# Patient Record
Sex: Male | Born: 1960
Health system: Southern US, Community
[De-identification: ages and names within clinical notes are randomized; demographics above are authoritative.]

## PROBLEM LIST (undated history)

## (undated) DIAGNOSIS — K219 Gastro-esophageal reflux disease without esophagitis: Secondary | ICD-10-CM

## (undated) DIAGNOSIS — I10 Essential (primary) hypertension: Secondary | ICD-10-CM

## (undated) DIAGNOSIS — G4730 Sleep apnea, unspecified: Secondary | ICD-10-CM

## (undated) DIAGNOSIS — E119 Type 2 diabetes mellitus without complications: Secondary | ICD-10-CM

## (undated) DIAGNOSIS — R519 Headache, unspecified: Secondary | ICD-10-CM

## (undated) DIAGNOSIS — R51 Headache: Secondary | ICD-10-CM

## (undated) HISTORY — DX: Essential (primary) hypertension: I10

## (undated) HISTORY — PX: UVULECTOMY: SHX2631

## (undated) HISTORY — DX: Type 2 diabetes mellitus without complications: E11.9

## (undated) HISTORY — PX: TONSILLECTOMY: SUR1361

## (undated) HISTORY — PX: APPENDECTOMY: SHX54

---

## 1998-12-26 ENCOUNTER — Ambulatory Visit (HOSPITAL_BASED_OUTPATIENT_CLINIC_OR_DEPARTMENT_OTHER): Admission: RE | Admit: 1998-12-26 | Discharge: 1998-12-26 | Payer: Self-pay | Admitting: *Deleted

## 2005-12-23 ENCOUNTER — Emergency Department (HOSPITAL_COMMUNITY): Admission: EM | Admit: 2005-12-23 | Discharge: 2005-12-23 | Payer: Self-pay | Admitting: Emergency Medicine

## 2007-07-05 ENCOUNTER — Encounter (INDEPENDENT_AMBULATORY_CARE_PROVIDER_SITE_OTHER): Payer: Self-pay | Admitting: General Surgery

## 2007-07-06 ENCOUNTER — Inpatient Hospital Stay (HOSPITAL_COMMUNITY): Admission: EM | Admit: 2007-07-06 | Discharge: 2007-07-07 | Payer: Self-pay | Admitting: Emergency Medicine

## 2010-11-20 NOTE — Op Note (Signed)
[name], Jackson Morris                 ACCOUNT NO.:  [id number]   MEDICAL RECORD NO.:  [id number]          PATIENT TYPE:  OBV   LOCATION:  A226                          FACILITY:  [facility]   PHYSICIAN:  Dalia Heading, M.D.  DATE OF BIRTH:  Apr 19, 1961   DATE OF PROCEDURE:  07/05/2007  DATE OF DISCHARGE:                               OPERATIVE REPORT   PREOPERATIVE DIAGNOSIS:  Acute appendicitis.   POSTOPERATIVE DIAGNOSIS:  Acute appendicitis.   PROCEDURE:  Laparoscopic appendectomy.   SURGEON:  Dalia Heading, M.D.   ANESTHESIA:  General endotracheal.   INDICATIONS:  The patient is a 50 year old white male who presents with  right lower quadrant abdominal pain.  CT scan of the abdomen and pelvis  reveals acute appendicitis.  The patient now comes to the operating room  for a laparoscopic appendectomy.  The risks and benefits of the  procedure, including bleeding, infection, and the possibility of an open  procedure were fully explained to the patient, who gave informed  consent.   PROCEDURE NOTE:  The patient was placed in the supine position.  After  induction of general endotracheal anesthesia, the abdomen was prepped  and draped using the usual sterile technique with Betadine.  Surgical  site confirmation was performed.   A supraumbilical incision was made down to the fascia.  A Veress needle  was introduced into the abdominal cavity, and confirmation of placement  was done using the saline drop test.  The abdomen was then insufflated  to 16 mmHg pressure.  An 11 mm trocar was introduced into the abdominal  cavity under direct visualization without difficulty.  The patient was  placed in deeper Trendelenburg position.  An additional 12 mm trocar was  placed in suprapubic region, and a 5mm trocar was placed in the left  lower quadrant region.  The appendix was visualized and noted to be  gangrenous in nature.  During the dissection, the appendicolith did  perforate through the  proximal portion of the appendix.  The  appendicolith as well as the appendix was removed.  A standard Endo-GIA  was placed across the base of the appendix and fired.  Two small clips  were also placed in an area of necrotic tissue.  The right lower  quadrant was copiously irrigated with normal saline.  There was some  minimal spillage during the procedure.  A #10 flat Jackson-Pratt drain  was placed into this region and brought out through the 5 mm trocar  site.  It was secured at the skin level using a 3-0 nylon interrupted  suture.  The staple line was inspected, and there was no evidence of  disruption.  All fluid and air were then evacuate from the abdominal  cavity prior to removal of the trocars.   All wounds were irrigated with normal saline.  All wounds were injected  with 0.5% Sensorcaine.  The supraumbilical fascia as well as suprapubic  fascia were reapproximated using 0 Vicryl interrupted sutures.  All skin  incisions were closed using staples.  Betadine ointment and dry sterile  dressings were applied.  All tape and needle counts were correct at the end of the procedure.  The patient was extubated in the operating room nd went back to recovery  room awake, in stable condition.   COMPLICATIONS:  None.   SPECIMEN:  Appendix.   BLOOD LOSS:  Minimal.   DRAINS:  Jackson-Pratt drain to right lower quadrant and pelvis.      Dalia Heading, M.D.  Electronically Signed     MAJ/MEDQ  D:  07/05/2007  T:  07/06/2007  Job:  [id number]   cc:   Ramon Dredge L. Juanetta Gosling, M.D.  Fax: [phone]

## 2010-11-23 NOTE — Discharge Summary (Signed)
Jackson Morris, Jackson Morris                 ACCOUNT NO.:  [id number]   MEDICAL RECORD NO.:  [id number]          PATIENT TYPE:  INP   LOCATION:  A322                          FACILITY:  [facility]   PHYSICIAN:  Dalia Heading, M.D.  DATE OF BIRTH:  [birth year]   DATE OF ADMISSION:  07/05/2007  DATE OF DISCHARGE:  12/30/2008LH                               DISCHARGE SUMMARY   HOSPITAL COURSE SUMMARY:  The patient is a 50 year old white male who  presented to the emergency room with worsening right lower quadrant  abdominal pain.  He was found to have acute appendicitis by CT scan of  the abdomen and pelvis.  He was taken to the operating room and  underwent a laparoscopic appendectomy.  He tolerated the surgery well.  His postoperative course was remarkable for nausea.  He thus was  admitted for an additional 24 hours.  His nausea subsequently resolved  and he was discharged home on July 07, 2007 in good and improving  condition.   DISCHARGE INSTRUCTIONS:  1. The patient was to follow up Dr. Franky Macho on July 14, 2007.  2. He was to drain his bulb suction twice a day.   DISCHARGE MEDICATIONS:  1. Augmentin 875 mg p.o. b.i.d.  2. Percocet 1-2 tablets p.o. q.4 h. p.r.n. pain.  3. Benicar 20 mg p.o. daily.  4. Prevacid 30 mg p.o. daily.  5. Vytorin 20 mg p.o. daily.  6. Actos 15 mg p.o. daily.  7. Glimepiride 10 mg p.o. daily.   PRINCIPAL DIAGNOSES:  1. Acute appendicitis.  2. Non-insulin-dependent diabetes mellitus.   PRINCIPAL PROCEDURE:  Laparoscopic appendectomy on July 05, 2007.      Dalia Heading, M.D.  Electronically Signed     MAJ/MEDQ  D:  07/22/2007  T:  07/22/2007  Job:  [id number]   cc:   Dalia Heading, M.D.  Fax: [phone]   Oneal Deputy. Juanetta Gosling, M.D.  Fax: [phone]

## 2011-04-12 LAB — BASIC METABOLIC PANEL
BUN: 18
CO2: 29
Calcium: 9.5
Chloride: 101
Chloride: 96
Creatinine, Ser: 1.05
Creatinine, Ser: 1.35
Potassium: 3.4 — ABNORMAL LOW
Sodium: 135

## 2011-04-12 LAB — CBC
HCT: 39.4
HCT: 41.2
HCT: 46.5
Hemoglobin: 13.6
Hemoglobin: 16.1
MCHC: 34.5
MCV: 90.4
MCV: 91.7
MCV: 91.8
Platelets: 187
Platelets: 196
RBC: 4.29
RBC: 5.15
RDW: 12.2
RDW: 12.6
RDW: 12.8

## 2011-04-12 LAB — DIFFERENTIAL
Basophils Absolute: 0
Eosinophils Absolute: 0
Eosinophils Absolute: 0
Eosinophils Relative: 0
Eosinophils Relative: 1
Lymphocytes Relative: 13
Lymphs Abs: 1.8
Lymphs Abs: 1.9
Monocytes Absolute: 1
Monocytes Absolute: 1.2 — ABNORMAL HIGH
Monocytes Relative: 5
Monocytes Relative: 8
Neutro Abs: 10.7 — ABNORMAL HIGH
Neutro Abs: 12.6 — ABNORMAL HIGH
Neutro Abs: 19.7 — ABNORMAL HIGH
Neutrophils Relative %: 78 — ABNORMAL HIGH

## 2011-04-12 LAB — URINALYSIS, ROUTINE W REFLEX MICROSCOPIC
Bilirubin Urine: NEGATIVE
Glucose, UA: NEGATIVE
Ketones, ur: NEGATIVE
Leukocytes, UA: NEGATIVE
Nitrite: NEGATIVE
Specific Gravity, Urine: 1.02

## 2011-04-12 LAB — URINE MICROSCOPIC-ADD ON

## 2013-09-15 ENCOUNTER — Telehealth: Payer: Self-pay

## 2013-09-15 NOTE — Telephone Encounter (Signed)
Pt was referred by Dr. Luan Pulling for screening colonoscopy. Having some constipation. OV with Neil Crouch, PA on 10/22/2013 at 10:30 AM.

## 2013-10-22 ENCOUNTER — Ambulatory Visit: Payer: Self-pay | Admitting: Gastroenterology

## 2013-11-11 ENCOUNTER — Ambulatory Visit (HOSPITAL_COMMUNITY)
Admission: RE | Admit: 2013-11-11 | Discharge: 2013-11-11 | Disposition: A | Discharge status: Home / Self Care | Payer: 59 | Source: Ambulatory Visit | Attending: Pulmonary Disease | Admitting: Pulmonary Disease

## 2013-11-11 ENCOUNTER — Other Ambulatory Visit (HOSPITAL_COMMUNITY): Payer: Self-pay | Admitting: Pulmonary Disease

## 2013-11-11 DIAGNOSIS — K5792 Diverticulitis of intestine, part unspecified, without perforation or abscess without bleeding: Secondary | ICD-10-CM

## 2013-11-11 DIAGNOSIS — K5732 Diverticulitis of large intestine without perforation or abscess without bleeding: Secondary | ICD-10-CM | POA: Insufficient documentation

## 2013-11-11 LAB — POCT I-STAT CREATININE: Creatinine, Ser: 1.4 mg/dL — ABNORMAL HIGH (ref 0.50–1.35)

## 2013-11-11 LAB — POCT I-STAT TROPONIN I: TROPONIN I, POC: 0 ng/mL (ref 0.00–0.08)

## 2013-11-11 MED ORDER — IOHEXOL 300 MG/ML  SOLN
50.0000 mL | Freq: Once | INTRAMUSCULAR | Status: AC | PRN
  Administered 2013-11-11: 50 mL via ORAL

## 2013-11-11 MED ORDER — SODIUM CHLORIDE 0.9 % IJ SOLN
INTRAMUSCULAR | Status: AC
  Filled 2013-11-11: qty 350

## 2013-11-11 MED ORDER — IOHEXOL 300 MG/ML  SOLN
100.0000 mL | Freq: Once | INTRAMUSCULAR | Status: AC | PRN
  Administered 2013-11-11: 100 mL via INTRAVENOUS

## 2013-11-26 ENCOUNTER — Ambulatory Visit: Payer: Self-pay | Admitting: Gastroenterology

## 2013-11-30 ENCOUNTER — Encounter: Payer: Self-pay | Admitting: Gastroenterology

## 2013-11-30 ENCOUNTER — Ambulatory Visit: Payer: 59 | Admitting: Gastroenterology

## 2013-11-30 ENCOUNTER — Telehealth: Payer: Self-pay | Admitting: Gastroenterology

## 2013-11-30 NOTE — Telephone Encounter (Signed)
Mailed letter °

## 2013-11-30 NOTE — Telephone Encounter (Signed)
Pt was a no show

## 2014-06-15 ENCOUNTER — Telehealth: Payer: Self-pay | Admitting: Gastroenterology

## 2014-06-15 NOTE — Telephone Encounter (Signed)
PATIENT WIFE CALLED TO RESCHEDULE PATIENT AND I TOLD HER THAT [facility] NEEDED TO SEND IN ANOTHER REFERRAL DUE TO THE TIME THE ORIGINAL ONE WAS PUT IN

## 2016-09-26 DIAGNOSIS — J209 Acute bronchitis, unspecified: Secondary | ICD-10-CM | POA: Diagnosis not present

## 2016-09-26 DIAGNOSIS — E119 Type 2 diabetes mellitus without complications: Secondary | ICD-10-CM | POA: Diagnosis not present

## 2016-09-26 DIAGNOSIS — J019 Acute sinusitis, unspecified: Secondary | ICD-10-CM | POA: Diagnosis not present

## 2016-10-21 NOTE — Telephone Encounter (Addendum)
I spoke with the patient's wife and she stated he's been having some constipation. I made the  patient ov to see LL 5/11 at 11 am

## 2016-11-05 DIAGNOSIS — I10 Essential (primary) hypertension: Secondary | ICD-10-CM | POA: Diagnosis not present

## 2016-11-05 DIAGNOSIS — E119 Type 2 diabetes mellitus without complications: Secondary | ICD-10-CM | POA: Diagnosis not present

## 2016-11-05 DIAGNOSIS — K210 Gastro-esophageal reflux disease with esophagitis: Secondary | ICD-10-CM | POA: Diagnosis not present

## 2016-11-07 ENCOUNTER — Telehealth: Payer: Self-pay | Admitting: Acute Care

## 2016-11-07 DIAGNOSIS — Z87891 Personal history of nicotine dependence: Secondary | ICD-10-CM

## 2016-11-07 NOTE — Telephone Encounter (Signed)
Routed to [facility] pool by mistake...rerouted to lung nodule pool.

## 2016-11-08 DIAGNOSIS — M25512 Pain in left shoulder: Secondary | ICD-10-CM | POA: Diagnosis not present

## 2016-11-08 DIAGNOSIS — E119 Type 2 diabetes mellitus without complications: Secondary | ICD-10-CM | POA: Diagnosis not present

## 2016-11-08 DIAGNOSIS — I10 Essential (primary) hypertension: Secondary | ICD-10-CM | POA: Diagnosis not present

## 2016-11-11 NOTE — Telephone Encounter (Signed)
ATC - Received a fast busy signal - Will try back

## 2016-11-14 NOTE — Telephone Encounter (Signed)
Returning Lung screening call. Wife is aware that Langley Gauss may not come in today so will await her call back

## 2016-11-14 NOTE — Telephone Encounter (Signed)
Pt wife returning call and can be reached @ [phone].Hillery Hunter

## 2016-11-15 ENCOUNTER — Encounter: Payer: Self-pay | Admitting: Gastroenterology

## 2016-11-15 ENCOUNTER — Ambulatory Visit (INDEPENDENT_AMBULATORY_CARE_PROVIDER_SITE_OTHER): Payer: 59 | Admitting: Gastroenterology

## 2016-11-15 ENCOUNTER — Other Ambulatory Visit: Payer: Self-pay

## 2016-11-15 DIAGNOSIS — K625 Hemorrhage of anus and rectum: Secondary | ICD-10-CM | POA: Diagnosis not present

## 2016-11-15 DIAGNOSIS — K5900 Constipation, unspecified: Secondary | ICD-10-CM | POA: Diagnosis not present

## 2016-11-15 MED ORDER — NA SULFATE-K SULFATE-MG SULF 17.5-3.13-1.6 GM/177ML PO SOLN: 1.0000 | ORAL | 0 refills | Status: DC

## 2016-11-15 MED ORDER — LINACLOTIDE 145 MCG PO CAPS: 145.0000 ug | ORAL_CAPSULE | Freq: Every day | ORAL | 3 refills | Status: DC

## 2016-11-15 NOTE — Assessment & Plan Note (Signed)
56 year old gentleman with history of intermittent rectal pain/rectal bleeding in the setting of constipation. No prior colonoscopy. No family history of colon cancer. Suspect benign anorectal bleeding however patient needs to undergo colonoscopy for diagnostic purposes.  I have discussed the risks, alternatives, benefits with regards to but not limited to the risk of reaction to medication, bleeding, infection, perforation and the patient is agreeable to proceed. Written consent to be obtained.  Trial of Linzess 145 g daily before breakfast for management of constipation. Samples provided. Prescription sent to pharmacy.

## 2016-11-15 NOTE — Telephone Encounter (Signed)
Spoke with pt and scheduled for [facility] 11/22/16 3:30 CT ordered  Nothing further needed

## 2016-11-15 NOTE — Patient Instructions (Signed)
1. Start Linzess 131mcg daily before breakfast. The first few days he may have loose stools but this should improve after one week on medication. I have provided samples. We also sent a prescription to your pharmacy. It's very important to get your bowels moving better prior to your colonoscopy so that we can make sure your prep was adequate and your entire colon can be seen well. 2. Colonoscopy as scheduled. Please see separate instructions.

## 2016-11-15 NOTE — Progress Notes (Signed)
Primary Care Physician:  Sinda Du, MD  Primary Gastroenterologist:  Garfield Cornea, MD   Chief Complaint  Patient presents with  . Constipation  . Colonoscopy    never had tcs  . Hemorrhoids    has bleeding  . Gastroesophageal Reflux    at times when eats certain foods    HPI:  Jackson Morris is a 56 y.o. male here at the request of Dr. Luan Pulling for colonoscopy. Patient has never had one. No known FH colon cancer. He has had issues with constipation. Some brbpr at times. May have 2-3 stools daily, [place] 1-3 but then may have 3 days without BM. Rectal pain at times and uses anucort as needed. Takes stool softener daily. Occasional heartburn, with certain foods. No dysphagia, vomiting, unintentional weight loss.      Current Outpatient Prescriptions  Medication Sig Dispense Refill  . acetaminophen (TYLENOL) 500 MG tablet Take 1,000 mg by mouth every 6 (six) hours as needed.    . fluticasone (FLONASE) 50 MCG/ACT nasal spray Place 2 sprays into both nostrils daily.    Marland Kitchen glimepiride (AMARYL) 4 MG tablet Take 1 tablet by mouth daily.  1  . GLYXAMBI 10-5 MG TABS Take 1 tablet by mouth daily.  1  . olmesartan-hydrochlorothiazide (BENICAR HCT) 40-25 MG tablet Take 1 tablet by mouth daily.  1  . OVER THE COUNTER MEDICATION Goody powder as needed for headache    . rosuvastatin (CRESTOR) 10 MG tablet Take 1 tablet by mouth daily.  1   No current facility-administered medications for this visit.     Allergies as of 11/15/2016  . (No Known Allergies)    Past Medical History:  Diagnosis Date  . Diabetes ([place])   . HTN (hypertension)     Past Surgical History:  Procedure Laterality Date  . APPENDECTOMY    . TONSILLECTOMY    . UVULECTOMY      Family History  Problem Relation Age of Onset  . Colon cancer Neg Hx     Social History   Social History  . Marital status: Married    Spouse name: N/A  . Number of children: N/A  . Years of education: N/A   Occupational History   . Not on file.   Social History Main Topics  . Smoking status: Current Every Day Smoker    Types: E-cigarettes  . Smokeless tobacco: Never Used  . Alcohol use No  . Drug use: No  . Sexual activity: Not on file   Other Topics Concern  . Not on file   Social History Narrative  . No narrative on file      ROS:  General: Negative for anorexia, weight loss, fever, chills, fatigue, weakness. Eyes: Negative for vision changes.  ENT: Negative for hoarseness, difficulty swallowing , nasal congestion. CV: Negative for chest pain, angina, palpitations, dyspnea on exertion, peripheral edema.  Respiratory: Negative for dyspnea at rest, dyspnea on exertion, cough, sputum, wheezing.  GI: See history of present illness. GU:  Negative for dysuria, hematuria, urinary incontinence, urinary frequency, nocturnal urination.  MS: Negative for joint pain, low back pain.  Derm: Negative for rash or itching.  Neuro: Negative for weakness, abnormal sensation, seizure, frequent headaches, memory loss, confusion.  Psych: Negative for anxiety, depression, suicidal ideation, hallucinations.  Endo: Negative for unusual weight change.  Heme: Negative for bruising or bleeding. Allergy: Negative for rash or hives.    Physical Examination:  BP 111/61   Pulse (!) 55   Temp 97.3  F (36.3 C) (Oral)   Ht 5\' 8"  (1.727 m)   Wt 212 lb (96.2 kg)   BMI 32.23 kg/m    General: Well-nourished, well-developed in no acute distress.  Head: Normocephalic, atraumatic.   Eyes: Conjunctiva pink, no icterus. Mouth: Oropharyngeal mucosa moist and pink , no lesions erythema or exudate. Neck: Supple without thyromegaly, masses, or lymphadenopathy.  Lungs: Clear to auscultation bilaterally.  Heart: Regular rate and rhythm, no murmurs rubs or gallops.  Abdomen: Bowel sounds are normal, nontender, nondistended, no hepatosplenomegaly or masses, no abdominal bruits or    hernia , no rebound or guarding.   Rectal:  deferred Extremities: No lower extremity edema. No clubbing or deformities.  Neuro: Alert and oriented x 4 , grossly normal neurologically.  Skin: Warm and dry, no rash or jaundice.   Psych: Alert and cooperative, normal mood and affect.

## 2016-11-18 ENCOUNTER — Other Ambulatory Visit: Payer: Self-pay

## 2016-11-18 DIAGNOSIS — K625 Hemorrhage of anus and rectum: Secondary | ICD-10-CM

## 2016-11-18 DIAGNOSIS — K5900 Constipation, unspecified: Secondary | ICD-10-CM

## 2016-11-18 NOTE — Patient Instructions (Signed)
PA info for Colonoscopy submitted via [facility] website. Case approved. PA# [id number].

## 2016-11-18 NOTE — Progress Notes (Signed)
CC'ED TO PCP 

## 2016-11-19 ENCOUNTER — Telehealth: Payer: Self-pay | Admitting: Acute Care

## 2016-11-21 NOTE — Telephone Encounter (Signed)
Spoke with pt's wife and informed that we do not have an earlier time slot tomorrow for [facility].  I offered her to reschedule to another day but she states that pt will keep this original appt.  Nothing further needed.

## 2016-11-22 ENCOUNTER — Ambulatory Visit (INDEPENDENT_AMBULATORY_CARE_PROVIDER_SITE_OTHER): Payer: 59 | Admitting: Acute Care

## 2016-11-22 ENCOUNTER — Encounter: Payer: Self-pay | Admitting: Acute Care

## 2016-11-22 ENCOUNTER — Ambulatory Visit (INDEPENDENT_AMBULATORY_CARE_PROVIDER_SITE_OTHER)
Admission: RE | Admit: 2016-11-22 | Discharge: 2016-11-22 | Disposition: A | Discharge status: Home / Self Care | Payer: 59 | Source: Ambulatory Visit | Attending: Acute Care | Admitting: Acute Care

## 2016-11-22 DIAGNOSIS — Z87891 Personal history of nicotine dependence: Secondary | ICD-10-CM | POA: Diagnosis not present

## 2016-11-22 NOTE — Progress Notes (Signed)
Shared Decision Making Visit Lung Cancer Screening Program [phone])   Eligibility:  Age 56 y.o.  Pack Years Smoking History Calculation 35 pack year smoking history (# packs/per year x # years smoked)  Recent History of coughing up blood  no  Unexplained weight loss? no ( >Than 15 pounds within the last 6 months )  Prior History Lung / other cancer no (Diagnosis within the last 5 years already requiring surveillance chest CT Scans).  Smoking Status Former Smoker  Former Smokers: Years since quit: 4 years  Quit Date: 07/08/2012  Visit Components:  Discussion included one or more decision making aids. yes  Discussion included risk/benefits of screening. yes  Discussion included potential follow up diagnostic testing for abnormal scans. yes  Discussion included meaning and risk of over diagnosis. yes  Discussion included meaning and risk of False Positives. yes  Discussion included meaning of total radiation exposure. yes  Counseling Included:  Importance of adherence to annual lung cancer LDCT screening. yes  Impact of comorbidities on ability to participate in the program. yes  Ability and willingness to under diagnostic treatment. yes  Smoking Cessation Counseling:  Current Smokers:   Discussed importance of smoking cessation. no  Information about tobacco cessation classes and interventions provided to patient. yes  Patient provided with "ticket" for LDCT Scan. yes  Symptomatic Patient. no  Counseling  Diagnosis Code: Tobacco Use Z72.0  Asymptomatic Patient yes  Counseling (Intermediate counseling: > three minutes counseling) U0454  Former Smokers:   Discussed the importance of maintaining cigarette abstinence. yes  Diagnosis Code: Personal History of Nicotine Dependence. U98.119  Information about tobacco cessation classes and interventions provided to patient. Yes  Patient provided with "ticket" for LDCT Scan. yes  Written Order for Lung  Cancer Screening with LDCT placed in Epic. Yes (CT Chest Lung Cancer Screening Low Dose W/O CM) [id number] Z12.2-Screening of respiratory organs Z87.891-Personal history of nicotine dependence  I spent 25 minutes of face to face time with Mr. And Mrs Remmel  discussing the risks and benefits of lung cancer screening. We viewed a power point together that explained in detail the above noted topics. We took the time to pause the power point at intervals to allow for questions to be asked and answered to ensure understanding. We discussed that he  had taken the single most powerful action possible to decrease his  risk of developing lung cancer when he quit smoking. I counseled him to remain smoke free, and to contact me if he  ever had the desire to smoke again so that I can provide resources and tools to help support the effort to remain smoke free. We discussed the time and location of the scan, and that either  Doroteo Glassman RN or I will call with the results within  24-48 hours of receiving them. Mr. Geister  has my card and contact information in the event he needs to speak with me, in addition to a copy of the power point we reviewed as a resource. The patient  verbalized understanding of all of the above and had no further questions upon leaving the office.   The patient is using an e cigarette to maintain tobacco free status. I have asked him to try to quit, as we do not know the effects of long term vaping.  I have spent 3 minutes counseling patient on smoking cessation this visit. ( He is a previous tobacco smoker, now an e cig smoker.)  I explained to the patient that  there has been a high incidence of coronary artery disease noted on these exams. I explained that this is a non-gated exam therefore degree or severity cannot be determined. This patient is currently on statin therapy. I have asked the patient to follow-up with their PCP regarding any incidental finding of coronary artery disease and  management with diet or medication as they feel is clinically indicated. The patient verbalized understanding of the above and had no further questions.     Magdalen Spatz, NP 11/22/2016

## 2016-11-27 ENCOUNTER — Other Ambulatory Visit: Payer: Self-pay | Admitting: Acute Care

## 2016-11-27 DIAGNOSIS — Z87891 Personal history of nicotine dependence: Secondary | ICD-10-CM

## 2016-12-03 ENCOUNTER — Telehealth: Payer: Self-pay | Admitting: Internal Medicine

## 2016-12-03 NOTE — Telephone Encounter (Signed)
[phone], pt wife stacy called and stated the linzess with the coupon was still 400$, can he take something else.  Please advise.

## 2016-12-03 NOTE — Telephone Encounter (Signed)
Called [facility] to find out if it was a PA issue or if the pt has a high deductible plan. Had to leave a message on the voicemail.  Asked the pharmacy to let me know what is going on with the rx.

## 2016-12-04 MED ORDER — POLYETHYLENE GLYCOL 3350 17 GM/SCOOP PO POWD: ORAL | 11 refills | Status: DC

## 2016-12-04 NOTE — Addendum Note (Signed)
Addended by: Mahala Menghini on: 12/04/2016 01:51 PM   Modules accepted: Orders

## 2016-12-04 NOTE — Telephone Encounter (Signed)
We can provide some samples if you want 145 or 290 to use prior to colonoscopy to get bowels moving and have adequate bowel prep. Take one on empty stomach starting 5 days before procedure.   Can use miralax for chronic regimen. 17gram orally twice daily until regular BM then once daily. RX sent.

## 2016-12-04 NOTE — Telephone Encounter (Signed)
Josh, Pharmacist from [employer], he said the pts copay with the copay card was $333.00. They think he has not met his deductible.

## 2016-12-20 ENCOUNTER — Telehealth: Payer: Self-pay

## 2016-12-20 ENCOUNTER — Encounter (HOSPITAL_COMMUNITY): Payer: Self-pay | Admitting: *Deleted

## 2016-12-20 ENCOUNTER — Ambulatory Visit (HOSPITAL_COMMUNITY)
Admission: RE | Admit: 2016-12-20 | Discharge: 2016-12-20 | Disposition: A | Discharge status: Home / Self Care | Payer: 59 | Source: Ambulatory Visit | Attending: Internal Medicine | Admitting: Internal Medicine

## 2016-12-20 ENCOUNTER — Encounter (HOSPITAL_COMMUNITY)
Admission: RE | Disposition: A | Discharge status: Home / Self Care | Payer: Self-pay | Source: Ambulatory Visit | Attending: Internal Medicine

## 2016-12-20 DIAGNOSIS — K640 First degree hemorrhoids: Secondary | ICD-10-CM

## 2016-12-20 DIAGNOSIS — Z79899 Other long term (current) drug therapy: Secondary | ICD-10-CM | POA: Insufficient documentation

## 2016-12-20 DIAGNOSIS — K635 Polyp of colon: Secondary | ICD-10-CM | POA: Diagnosis not present

## 2016-12-20 DIAGNOSIS — K5900 Constipation, unspecified: Secondary | ICD-10-CM | POA: Insufficient documentation

## 2016-12-20 DIAGNOSIS — D122 Benign neoplasm of ascending colon: Secondary | ICD-10-CM | POA: Diagnosis not present

## 2016-12-20 DIAGNOSIS — Z87891 Personal history of nicotine dependence: Secondary | ICD-10-CM | POA: Diagnosis not present

## 2016-12-20 DIAGNOSIS — Z833 Family history of diabetes mellitus: Secondary | ICD-10-CM | POA: Insufficient documentation

## 2016-12-20 DIAGNOSIS — E119 Type 2 diabetes mellitus without complications: Secondary | ICD-10-CM | POA: Insufficient documentation

## 2016-12-20 DIAGNOSIS — I10 Essential (primary) hypertension: Secondary | ICD-10-CM | POA: Insufficient documentation

## 2016-12-20 DIAGNOSIS — D120 Benign neoplasm of cecum: Secondary | ICD-10-CM

## 2016-12-20 DIAGNOSIS — K621 Rectal polyp: Secondary | ICD-10-CM | POA: Insufficient documentation

## 2016-12-20 DIAGNOSIS — K219 Gastro-esophageal reflux disease without esophagitis: Secondary | ICD-10-CM | POA: Insufficient documentation

## 2016-12-20 DIAGNOSIS — Z7984 Long term (current) use of oral hypoglycemic drugs: Secondary | ICD-10-CM | POA: Diagnosis not present

## 2016-12-20 DIAGNOSIS — D127 Benign neoplasm of rectosigmoid junction: Secondary | ICD-10-CM | POA: Diagnosis not present

## 2016-12-20 DIAGNOSIS — K625 Hemorrhage of anus and rectum: Secondary | ICD-10-CM

## 2016-12-20 DIAGNOSIS — K921 Melena: Secondary | ICD-10-CM

## 2016-12-20 DIAGNOSIS — Z823 Family history of stroke: Secondary | ICD-10-CM | POA: Diagnosis not present

## 2016-12-20 HISTORY — DX: Gastro-esophageal reflux disease without esophagitis: K21.9

## 2016-12-20 HISTORY — DX: Headache: R51

## 2016-12-20 HISTORY — PX: COLONOSCOPY: SHX5424

## 2016-12-20 HISTORY — DX: Headache, unspecified: R51.9

## 2016-12-20 LAB — GLUCOSE, CAPILLARY: Glucose-Capillary: 122 mg/dL — ABNORMAL HIGH (ref 65–99)

## 2016-12-20 SURGERY — COLONOSCOPY
Anesthesia: Moderate Sedation

## 2016-12-20 MED ORDER — ONDANSETRON HCL 4 MG/2ML IJ SOLN
INTRAMUSCULAR | Status: DC | PRN
  Administered 2016-12-20: 4 mg via INTRAVENOUS

## 2016-12-20 MED ORDER — ONDANSETRON HCL 4 MG/2ML IJ SOLN
INTRAMUSCULAR | Status: AC
  Filled 2016-12-20: qty 2

## 2016-12-20 MED ORDER — SODIUM CHLORIDE 0.9 % IV SOLN
INTRAVENOUS | Status: DC
  Administered 2016-12-20: 1000 mL via INTRAVENOUS

## 2016-12-20 MED ORDER — SIMETHICONE 40 MG/0.6ML PO SUSP
ORAL | Status: AC
  Filled 2016-12-20: qty 30

## 2016-12-20 MED ORDER — MIDAZOLAM HCL 5 MG/5ML IJ SOLN
INTRAMUSCULAR | Status: AC
  Filled 2016-12-20: qty 10

## 2016-12-20 MED ORDER — MIDAZOLAM HCL 5 MG/5ML IJ SOLN
INTRAMUSCULAR | Status: DC | PRN
  Administered 2016-12-20 (×2): 2 mg via INTRAVENOUS
  Administered 2016-12-20: 1 mg via INTRAVENOUS

## 2016-12-20 MED ORDER — MEPERIDINE HCL 100 MG/ML IJ SOLN
INTRAMUSCULAR | Status: DC | PRN
  Administered 2016-12-20: 50 mg via INTRAVENOUS
  Administered 2016-12-20: 25 mg via INTRAVENOUS

## 2016-12-20 MED ORDER — MEPERIDINE HCL 100 MG/ML IJ SOLN
INTRAMUSCULAR | Status: AC
  Filled 2016-12-20: qty 2

## 2016-12-20 NOTE — Op Note (Signed)
[facility] Patient Name: Jackson Morris Procedure Date: 12/20/2016 7:31 AM MRN: [id number] Date of Birth: Jan 26, 1961 Attending MD: Norvel Richards , MD CSN: [id number] Age: 56 Admit Type: Outpatient Procedure:                Colonoscopy Indications:              Hematochezia Providers:                Norvel Richards, MD, Lurline Del, RN, Purcell Nails.                            Banks, Merchant navy officer Referring MD:              Medicines:                Midazolam 5 mg IV, Meperidine 75 mg IV, Ondansetron                            4 mg IV Complications:            No immediate complications. Estimated Blood Loss:     Estimated blood loss was minimal. Procedure:                Pre-Anesthesia Assessment:                           - Prior to the procedure, a History and Physical                            was performed, and patient medications and                            allergies were reviewed. The patient's tolerance of                            previous anesthesia was also reviewed. The risks                            and benefits of the procedure and the sedation                            options and risks were discussed with the patient.                            All questions were answered, and informed consent                            was obtained. Prior Anticoagulants: The patient has                            taken no previous anticoagulant or antiplatelet                            agents. ASA Grade Assessment: II - A patient with  mild systemic disease. After reviewing the risks                            and benefits, the patient was deemed in                            satisfactory condition to undergo the procedure.                           After obtaining informed consent, the colonoscope                            was passed under direct vision. Throughout the                            procedure, the patient's blood pressure, pulse,  and                            oxygen saturations were monitored continuously. The                            Colonoscope was introduced through the anus and                            advanced to the the cecum, identified by                            appendiceal orifice and ileocecal valve. The                            patient tolerated the procedure well. The entire                            colon was well visualized. The ileocecal valve,                            appendiceal orifice, and rectum were photographed.                            The quality of the bowel preparation was adequate. Scope In: 7:46:40 AM Scope Out: 8:16:30 AM Scope Withdrawal Time: 0 hours 24 minutes 54 seconds  Total Procedure Duration: 0 hours 29 minutes 50 seconds  Findings:      The perianal and digital rectal examinations were normal.      A 5 mm polyp was found in the recto-sigmoid colon. The polyp was       semi-pedunculated. The polyp was removed with a cold snare. Resection       and retrieval were complete. Estimated blood loss was minimal.      Internal hemorrhoids were found during retroflexion. The hemorrhoids       were moderate, medium-sized and Grade I (internal hemorrhoids that do       not prolapse).      A 11 mm polyp was found in the ileocecal valve. The polyp was sessile.       The polyp  was removed with a hot snare. Resection and retrieval were       complete. Estimated blood loss was minimal. Cautery malfunction- partial       cold snaring of larger polyp. Ultimately, 2 hemostasis clips placed to       ensure continued hemostasis with the healing process.      Two sessile polyps were found in the ascending colon. The polyps were 4       to 6 mm in size. These polyps were removed with a cold snare. Resection       and retrieval were complete. Estimated blood loss was minimal.      The exam was otherwise without abnormality on direct and retroflexion       views. Impression:                - One 5 mm polyp at the recto-sigmoid colon,                            removed with a cold snare. Resected and retrieved.                           - Internal hemorrhoids.                           - One 11 mm polyp at the ileocecal valve, removed                            with a hot snare. Resected and retrieved.                           - Two 4 to 6 mm polyps in the ascending colon,                            removed with a cold snare. Resected and retrieved.                           - The examination was otherwise normal on direct                            and retroflexion views. I suspect bleeding from                            hemorrhoids in the setting of constipation. Moderate Sedation:      Moderate (conscious) sedation was administered by the endoscopy nurse       and supervised by the endoscopist. The following parameters were       monitored: oxygen saturation, heart rate, blood pressure, respiratory       rate, EKG, adequacy of pulmonary ventilation, and response to care.       Total physician intraservice time was 37 minutes. Recommendation:           - Patient has a contact number available for                            emergencies. The signs and symptoms of potential  delayed complications were discussed with the                            patient. Return to normal activities tomorrow.                            Written discharge instructions were provided to the                            patient.                           - Resume previous diet.                           - Continue present medications. Decrease dose of                            lLinzess to 72 g daily?"samples available the                            office.                           - Repeat colonoscopy date to be determined after                            pending pathology results are reviewed for                            surveillance based on pathology  results.                           - Return to GI clinic in 6 weeks. No MRI until clear Procedure Code(s):        --- Professional ---                           [phone], Colonoscopy, flexible; with removal of                            tumor(s), polyp(s), or other lesion(s) by snare                            technique                           99152, Moderate sedation services provided by the                            same physician or other qualified health care                            professional performing the diagnostic or                            therapeutic service that the  sedation supports,                            requiring the presence of an independent trained                            observer to assist in the monitoring of the                            patient's level of consciousness and physiological                            status; initial 15 minutes of intraservice time,                            patient age 66 years or older                           [phone], Moderate sedation services; each additional                            15 minutes intraservice time Diagnosis Code(s):        --- Professional ---                           D12.7, Benign neoplasm of rectosigmoid junction                           D12.0, Benign neoplasm of cecum                           D12.2, Benign neoplasm of ascending colon                           K64.0, First degree hemorrhoids                           K92.1, Melena (includes Hematochezia) CPT copyright 2016 American Medical Association. All rights reserved. The codes documented in this report are preliminary and upon coder review may  be revised to meet current compliance requirements. Cristopher Estimable. Mariona Scholes, MD Norvel Richards, MD 12/20/2016 8:30:15 AM This report has been signed electronically. Number of Addenda: 0

## 2016-12-20 NOTE — Telephone Encounter (Signed)
Pt came by the office today after his procedure to get samples of Linzess 72 mcg but we did not have any. I told him that we would call when we got some in.

## 2016-12-20 NOTE — H&P (Signed)
@LOGO @   Primary Care Physician:  Sinda Du, MD Primary Gastroenterologist:  Dr. Gala Romney  Pre-Procedure History & Physical: HPI:  Jackson Morris is a 56 y.o. male here for here for colonoscopy due to rectal bleeding constipation or to lens is 145-gave him diarrhea. Did not like it.  Past Medical History:  Diagnosis Date  . Diabetes ([place])   . GERD (gastroesophageal reflux disease)   . Headache   . HTN (hypertension)     Past Surgical History:  Procedure Laterality Date  . APPENDECTOMY    . TONSILLECTOMY    . UVULECTOMY      Prior to Admission medications   Medication Sig Start Date End Date Taking? Authorizing Provider  acetaminophen (TYLENOL) 500 MG tablet Take 1,000 mg by mouth every 6 (six) hours as needed.   Yes [provider]  Aspirin-Acetaminophen-Caffeine (GOODYS EXTRA STRENGTH) [phone] MG PACK Take 1 Package by mouth daily as needed.   Yes [provider]  clotrimazole-betamethasone (LOTRISONE) cream Apply 1 application topically daily as needed (skin irritation).  09/26/16  Yes [provider]  docusate sodium (COLACE) 100 MG capsule Take 100 mg by mouth daily.   Yes [provider]  glimepiride (AMARYL) 4 MG tablet Take 1 tablet by mouth daily. 10/17/16  Yes [provider]  GLYXAMBI 10-5 MG TABS Take 1 tablet by mouth daily. 10/18/16  Yes [provider]  olmesartan-hydrochlorothiazide (BENICAR HCT) 40-25 MG tablet Take 1 tablet by mouth daily. 10/17/16  Yes [provider]  rosuvastatin (CRESTOR) 10 MG tablet Take 1 tablet by mouth daily. 10/17/16  Yes [provider]    Allergies as of 11/18/2016  . (No Known Allergies)    Family History  Problem Relation Age of Onset  . Cancer Mother   . Stroke Mother   . Diabetes Father   . Colon cancer Neg Hx     Social History   Social History  . Marital status: Married    Spouse name: N/A  . Number of children: N/A  . Years of education:  N/A   Occupational History  . Not on file.   Social History Main Topics  . Smoking status: Former Smoker    Packs/day: 1.00    Years: 35.00    Types: Cigarettes    Quit date: 07/08/2012  . Smokeless tobacco: Never Used     Comment: Currently smokes e cig. Quit tobacco in 2014  . Alcohol use No  . Drug use: No  . Sexual activity: Not on file   Other Topics Concern  . Not on file   Social History Narrative  . No narrative on file    Review of Systems: See HPI, otherwise negative ROS  Physical Exam: BP 127/78   Pulse (!) 51   Temp 97.8 F (36.6 C) (Oral)   Resp 13   SpO2 100%  General:   Alert,  Well-developed, well-nourished, pleasant and cooperative in NAD Neck:  Supple; no masses or thyromegaly. No significant cervical adenopathy. Lungs:  Clear throughout to auscultation.   No wheezes, crackles, or rhonchi. No acute distress. Heart:  Regular rate and rhythm; no murmurs, clicks, rubs,  or gallops. Abdomen: Non-distended, normal bowel sounds.  Soft and nontender without appreciable mass or hepatosplenomegaly.  Pulses:  Normal pulses noted. Extremities:  Without clubbing or edema.  Impression:  Pleasant 56 year old gentleman with intermittent rectal bleeding ascending constipation. No prior colonoscopy. Agree, with a need for colonoscopy at this time.  Recommendations:  The patient  that diagnostic colonoscopy today.   The risks, benefits, limitations, alternatives and imponderables have been reviewed with the patient. Questions have been answered. All parties are agreeable.   Notice: This dictation was prepared with Dragon dictation along with smaller phrase technology. Any transcriptional errors that result from this process are unintentional and may not be corrected upon review.

## 2016-12-20 NOTE — Discharge Instructions (Addendum)
Colonoscopy Discharge Instructions  Read the instructions outlined below and refer to this sheet in the next few weeks. These discharge instructions provide you with general information on caring for yourself after you leave the hospital. Your doctor may also give you specific instructions. While your treatment has been planned according to the most current medical practices available, unavoidable complications occasionally occur. If you have any problems or questions after discharge, call Dr. Gala Romney at [phone]. ACTIVITY  You may resume your regular activity, but move at a slower pace for the next 24 hours.   Take frequent rest periods for the next 24 hours.   Walking will help get rid of the air and reduce the bloated feeling in your belly (abdomen).   No driving for 24 hours (because of the medicine (anesthesia) used during the test).    Do not sign any important legal documents or operate any machinery for 24 hours (because of the anesthesia used during the test).  NUTRITION  Drink plenty of fluids.   You may resume your normal diet as instructed by your doctor.   Begin with a light meal and progress to your normal diet. Heavy or fried foods are harder to digest and may make you feel sick to your stomach (nauseated).   Avoid alcoholic beverages for 24 hours or as instructed.  MEDICATIONS  You may resume your normal medications unless your doctor tells you otherwise.  WHAT YOU CAN EXPECT TODAY  Some feelings of bloating in the abdomen.   Passage of more gas than usual.   Spotting of blood in your stool or on the toilet paper.  IF YOU HAD POLYPS REMOVED DURING THE COLONOSCOPY:  No aspirin products for 7 days or as instructed.   No alcohol for 7 days or as instructed.   Eat a soft diet for the next 24 hours.  FINDING OUT THE RESULTS OF YOUR TEST Not all test results are available during your visit. If your test results are not back during the visit, make an appointment  with your caregiver to find out the results. Do not assume everything is normal if you have not heard from your caregiver or the medical facility. It is important for you to follow up on all of your test results.  SEEK IMMEDIATE MEDICAL ATTENTION IF:  You have more than a spotting of blood in your stool.   Your belly is swollen (abdominal distention).   You are nauseated or vomiting.   You have a temperature over 101.   You have abdominal pain or discomfort that is severe or gets worse throughout the day.     Hemorrhoid and colon polyp information provided  No MRI in the future total clips gone  Trial of Linzess 72 - 1 daily. Go by the office for free samples  Pamphlet on hemorrhoid banding provided  Further recommendations to follow pending review of pathology report  Office visit with [place] in 6 weeks   Hemorrhoids Hemorrhoids are swollen veins in and around the rectum or anus. There are two types of hemorrhoids:  Internal hemorrhoids. These occur in the veins that are just inside the rectum. They may poke through to the outside and become irritated and painful.  External hemorrhoids. These occur in the veins that are outside of the anus and can be felt as a painful swelling or hard lump near the anus.  Most hemorrhoids do not cause serious problems, and they can be managed with home treatments such as diet and lifestyle  changes. If home treatments do not help your symptoms, procedures can be done to shrink or remove the hemorrhoids. What are the causes? This condition is caused by increased pressure in the anal area. This pressure may result from various things, including:  Constipation.  Straining to have a bowel movement.  Diarrhea.  Pregnancy.  Obesity.  Sitting for long periods of time.  Heavy lifting or other activity that causes you to strain.  Anal sex.  What are the signs or symptoms? Symptoms of this condition include:  Pain.  Anal itching or  irritation.  Rectal bleeding.  Leakage of stool (feces).  Anal swelling.  One or more lumps around the anus.  How is this diagnosed? This condition can often be diagnosed through a visual exam. Other exams or tests may also be done, such as:  Examination of the rectal area with a gloved hand (digital rectal exam).  Examination of the anal canal using a small tube (anoscope).  A blood test, if you have lost a significant amount of blood.  A test to look inside the colon (sigmoidoscopy or colonoscopy).  How is this treated? This condition can usually be treated at home. However, various procedures may be done if dietary changes, lifestyle changes, and other home treatments do not help your symptoms. These procedures can help make the hemorrhoids smaller or remove them completely. Some of these procedures involve surgery, and others do not. Common procedures include:  Rubber band ligation. Rubber bands are placed at the base of the hemorrhoids to cut off the blood supply to them.  Sclerotherapy. Medicine is injected into the hemorrhoids to shrink them.  Infrared coagulation. A type of light energy is used to get rid of the hemorrhoids.  Hemorrhoidectomy surgery. The hemorrhoids are surgically removed, and the veins that supply them are tied off.  Stapled hemorrhoidopexy surgery. A circular stapling device is used to remove the hemorrhoids and use staples to cut off the blood supply to them.  Follow these instructions at home: Eating and drinking  Eat foods that have a lot of fiber in them, such as whole grains, beans, nuts, fruits, and vegetables. Ask your health care provider about taking products that have added fiber (fiber supplements).  Drink enough fluid to keep your urine clear or pale yellow. Managing pain and swelling  Take warm sitz baths for 20 minutes, 3-4 times a day to ease pain and discomfort.  If directed, apply ice to the affected area. Using ice packs  between sitz baths may be helpful. ? Put ice in a plastic bag. ? Place a towel between your skin and the bag. ? Leave the ice on for 20 minutes, 2-3 times a day. General instructions  Take over-the-counter and prescription medicines only as told by your health care provider.  Use medicated creams or suppositories as told.  Exercise regularly.  Go to the bathroom when you have the urge to have a bowel movement. Do not wait.  Avoid straining to have bowel movements.  Keep the anal area dry and clean. Use wet toilet paper or moist towelettes after a bowel movement.  Do not sit on the toilet for long periods of time. This increases blood pooling and pain. Contact a health care provider if:  You have increasing pain and swelling that are not controlled by treatment or medicine.  You have uncontrolled bleeding.  You have difficulty having a bowel movement, or you are unable to have a bowel movement.  You have pain or  inflammation outside the area of the hemorrhoids. This information is not intended to replace advice given to you by your health care provider. Make sure you discuss any questions you have with your health care provider. Document Released: 06/21/2000 Document Revised: 11/22/2015 Document Reviewed: 03/08/2015 Elsevier Interactive Patient Education  2017 [place].  Colon Polyps Polyps are tissue growths inside the body. Polyps can grow in many places, including the large intestine (colon). A polyp may be a round bump or a mushroom-shaped growth. You could have one polyp or several. Most colon polyps are noncancerous (benign). However, some colon polyps can become cancerous over time. What are the causes? The exact cause of colon polyps is not known. What increases the risk? This condition is more likely to develop in people who:  Have a family history of colon cancer or colon polyps.  Are older than 48 or older than 45 if they are African American.  Have  inflammatory bowel disease, such as ulcerative colitis or Crohn disease.  Are overweight.  Smoke cigarettes.  Do not get enough exercise.  Drink too much alcohol.  Eat a diet that is: ? High in fat and red meat. ? Low in fiber.  Had childhood cancer that was treated with abdominal radiation.  What are the signs or symptoms? Most polyps do not cause symptoms. If you have symptoms, they may include:  Blood coming from your rectum when having a bowel movement.  Blood in your stool.The stool may look dark red or black.  A change in bowel habits, such as constipation or diarrhea.  How is this diagnosed? This condition is diagnosed with a colonoscopy. This is a procedure that uses a lighted, flexible scope to look at the inside of your colon. How is this treated? Treatment for this condition involves removing any polyps that are found. Those polyps will then be tested for cancer. If cancer is found, your health care provider will talk to you about options for colon cancer treatment. Follow these instructions at home: Diet  Eat plenty of fiber, such as fruits, vegetables, and whole grains.  Eat foods that are high in calcium and vitamin D, such as milk, cheese, yogurt, eggs, liver, fish, and broccoli.  Limit foods high in fat, red meats, and processed meats, such as hot dogs, sausage, bacon, and lunch meats.  Maintain a healthy weight, or lose weight if recommended by your health care provider. General instructions  Do not smoke cigarettes.  Do not drink alcohol excessively.  Keep all follow-up visits as told by your health care provider. This is important. This includes keeping regularly scheduled colonoscopies. Talk to your health care provider about when you need a colonoscopy.  Exercise every day or as told by your health care provider. Contact a health care provider if:  You have new or worsening bleeding during a bowel movement.  You have new or increased blood in  your stool.  You have a change in bowel habits.  You unexpectedly lose weight. This information is not intended to replace advice given to you by your health care provider. Make sure you discuss any questions you have with your health care provider. Document Released: 03/20/2004 Document Revised: 11/30/2015 Document Reviewed: 05/15/2015 Elsevier Interactive Patient Education  [employer].

## 2016-12-23 ENCOUNTER — Encounter: Payer: Self-pay | Admitting: Internal Medicine

## 2016-12-24 NOTE — Telephone Encounter (Signed)
Pt will come by tomorrow to pick up samples  

## 2016-12-25 ENCOUNTER — Encounter (HOSPITAL_COMMUNITY): Payer: Self-pay | Admitting: Internal Medicine

## 2017-02-11 ENCOUNTER — Ambulatory Visit: Payer: 59 | Admitting: Nurse Practitioner

## 2017-02-11 ENCOUNTER — Ambulatory Visit (INDEPENDENT_AMBULATORY_CARE_PROVIDER_SITE_OTHER): Payer: 59 | Admitting: Gastroenterology

## 2017-02-11 ENCOUNTER — Encounter: Payer: Self-pay | Admitting: Gastroenterology

## 2017-02-11 VITALS — BP 107/63 | HR 66 | Temp 98.3°F | Ht 68.0 in | Wt 208.6 lb

## 2017-02-11 DIAGNOSIS — K5900 Constipation, unspecified: Secondary | ICD-10-CM

## 2017-02-11 NOTE — Progress Notes (Signed)
cc'ed to pcp °

## 2017-02-11 NOTE — Assessment & Plan Note (Signed)
Improved with fiber supplementation. Add probiotic once daily. Samples provided. Miralax if needed. Unable to obtain prescription-strength Linzess due to significant cost despite co-pay. Will attempt supportive measures. Colonoscopy in 3 years due to multiple polyps. Return in 1 year. Discussed hemorrhoid banding if persistent rectal bleeding, although this has tapered. Wanting to hold off for now.

## 2017-02-11 NOTE — Patient Instructions (Addendum)
Continue the fiber supplements daily.   You can add a probiotic daily such as Restora, [employer], [employer], [employer], [facility] brand.   We will see you in 1 year!

## 2017-02-11 NOTE — Progress Notes (Signed)
Referring Provider: Sinda Du, MD Primary Care Physician:  Sinda Du, MD Primary GI: Dr. Gala Romney   Chief Complaint  Patient presents with  . Constipation    HPI:   Jackson SIEVERS is a 56 y.o. male presenting today with a history of rectal bleeding and constipation. Recent colonoscopy with multiple polyps, will need surveillance 3 years.   Linzess cost about 400$ even after co-pay card. Taking fiber capsules. Constipation is improved but still some issues. BM usually about every day. Sometimes straining. Rare rectal bleeding.    Past Medical History:  Diagnosis Date  . Diabetes ([place])   . GERD (gastroesophageal reflux disease)   . Headache   . HTN (hypertension)     Past Surgical History:  Procedure Laterality Date  . APPENDECTOMY    . COLONOSCOPY N/A 12/20/2016   Procedure: COLONOSCOPY;  Surgeon: Daneil Dolin, MD;  Location: [facility];  Service: Endoscopy;  Laterality: N/A;  7:30am  . TONSILLECTOMY    . UVULECTOMY      Current Outpatient Prescriptions  Medication Sig Dispense Refill  . acetaminophen (TYLENOL) 500 MG tablet Take 1,000 mg by mouth every 6 (six) hours as needed.    . Aspirin-Acetaminophen-Caffeine (GOODYS EXTRA STRENGTH) [phone] MG PACK Take 1 Package by mouth daily as needed.    . clotrimazole-betamethasone (LOTRISONE) cream Apply 1 application topically daily as needed (skin irritation).   0  . docusate sodium (COLACE) 100 MG capsule Take 100 mg by mouth daily.    Marland Kitchen glimepiride (AMARYL) 4 MG tablet Take 1 tablet by mouth daily.  1  . GLYXAMBI 10-5 MG TABS Take 1 tablet by mouth daily.  1  . olmesartan-hydrochlorothiazide (BENICAR HCT) 40-25 MG tablet Take 1 tablet by mouth daily.  1  . rosuvastatin (CRESTOR) 10 MG tablet Take 1 tablet by mouth daily.  1   No current facility-administered medications for this visit.     Allergies as of 02/11/2017  . (No Known Allergies)    Family History  Problem Relation Age of Onset  .  Cancer Mother   . Stroke Mother   . Diabetes Father   . Colon cancer Neg Hx     Social History   Social History  . Marital status: Married    Spouse name: N/A  . Number of children: N/A  . Years of education: N/A   Social History Main Topics  . Smoking status: Former Smoker    Packs/day: 1.00    Years: 35.00    Types: Cigarettes    Quit date: 07/08/2012  . Smokeless tobacco: Never Used     Comment: Currently smokes e cig. Quit tobacco in 2014  . Alcohol use No  . Drug use: No  . Sexual activity: Not Asked   Other Topics Concern  . None   Social History Narrative  . None    Review of Systems: As mentioned in HPI   Physical Exam: BP 107/63   Pulse 66   Temp 98.3 F (36.8 C) (Oral)   Ht 5\' 8"  (1.727 m)   Wt 208 lb 9.6 oz (94.6 kg)   BMI 31.72 kg/m  General:   Alert and oriented. No distress noted. Pleasant and cooperative.  Head:  Normocephalic and atraumatic. Eyes:  Conjuctiva clear without scleral icterus. Mouth:  Oral mucosa pink and moist. Good dentition. No lesions. Abdomen:  +BS, soft, non-tender and non-distended. No rebound or guarding. No HSM or masses noted. Msk:  Symmetrical without gross  deformities. Normal posture. Extremities:  Without edema. Neurologic:  Alert and  oriented x4;  grossly normal neurologically. Psych:  Alert and cooperative. Normal mood and affect.

## 2017-02-24 DIAGNOSIS — K210 Gastro-esophageal reflux disease with esophagitis: Secondary | ICD-10-CM | POA: Diagnosis not present

## 2017-02-24 DIAGNOSIS — E119 Type 2 diabetes mellitus without complications: Secondary | ICD-10-CM | POA: Diagnosis not present

## 2017-02-24 DIAGNOSIS — I10 Essential (primary) hypertension: Secondary | ICD-10-CM | POA: Diagnosis not present

## 2017-02-28 DIAGNOSIS — E119 Type 2 diabetes mellitus without complications: Secondary | ICD-10-CM | POA: Diagnosis not present

## 2017-02-28 DIAGNOSIS — K210 Gastro-esophageal reflux disease with esophagitis: Secondary | ICD-10-CM | POA: Diagnosis not present

## 2017-02-28 DIAGNOSIS — K5904 Chronic idiopathic constipation: Secondary | ICD-10-CM | POA: Diagnosis not present

## 2017-02-28 DIAGNOSIS — I10 Essential (primary) hypertension: Secondary | ICD-10-CM | POA: Diagnosis not present

## 2017-04-22 ENCOUNTER — Ambulatory Visit (INDEPENDENT_AMBULATORY_CARE_PROVIDER_SITE_OTHER): Payer: 59 | Admitting: Internal Medicine

## 2017-04-22 ENCOUNTER — Encounter: Payer: Self-pay | Admitting: Internal Medicine

## 2017-04-22 VITALS — BP 140/77 | HR 87 | Temp 97.1°F | Ht 68.0 in | Wt 211.6 lb

## 2017-04-22 DIAGNOSIS — K648 Other hemorrhoids: Secondary | ICD-10-CM

## 2017-04-22 NOTE — Progress Notes (Signed)
[place] banding procedure note:  Patient continues to be constipated and strains; spends too much time on the commode. Linzess was too expensive. The patient presents with symptomatic  hemorrhoids, unresponsive to maximal medical therapy, requesting rubber band ligation of his hemorrhoidal disease. All risks, benefits, and alternative forms of therapy were described and informed consent was obtained.  In the left lateral decubitus position, and DR E reveal no abnormalities. Anoscopy revealed a prominent right anterior and left lateral hemorrhoid column only.  Xylocaine and nitroglycerin ointment used as lubricant.  The decision was made to band the right anterior internal hemorrhoid;  the [place] was used to perform band ligation without complication. Digital anorectal examination was then performed to assure proper positioning of the band;  band found to be in excellent position. No pinching or pain.  The patient was discharged home without pain or other issues. Dietary and behavioral recommendations were given. No complications were encountered and the patient tolerated the procedure well.  Add MiraLAX daily when necessary to his regimen. See discharge instructions.

## 2017-04-22 NOTE — Patient Instructions (Signed)
Avoid straining.  Constipation information provided  Limit toilet time to 5-7 minutes  Use MiraLAX one cap full at bedtime on any given day you  did not have a bowel movement. If you have a good bowel movement on a given day you can leave it off for that day  Call with any interim problems  Schedule followup appointment in 4 weeks from now

## 2017-05-23 ENCOUNTER — Encounter: Payer: Self-pay | Admitting: Internal Medicine

## 2017-05-23 ENCOUNTER — Ambulatory Visit (INDEPENDENT_AMBULATORY_CARE_PROVIDER_SITE_OTHER): Payer: 59 | Admitting: Internal Medicine

## 2017-05-23 VITALS — BP 126/73 | HR 53 | Temp 97.0°F | Ht 68.0 in | Wt 212.2 lb

## 2017-05-23 DIAGNOSIS — K648 Other hemorrhoids: Secondary | ICD-10-CM

## 2017-05-23 DIAGNOSIS — K641 Second degree hemorrhoids: Secondary | ICD-10-CM | POA: Diagnosis not present

## 2017-05-23 NOTE — Progress Notes (Signed)
[place] banding procedure note:  The patient presents with symptomatic grade 2 hemorrhoids  -  Status post banding of the right anterior hemorrhoid previously. Patient felt that may have come off early. Although he does state some is hemorrhoid symptoms have improved. He desires surgical banding today. All risks, benefits, and alternative forms of therapy were described and informed consent was obtained.  In the left lateral decubitus position, DRE utilizing Xylocaine and nitroglycerin as lubricant performed reveal no abnormalities.  The decision was made to band the right lateral internal hemorrhoid;  the [place] was used to perform band ligation without complication. Digital anorectal examination was then performed to assure proper positioning of the band, ;  Band fill be excellent position. No pinching or pain subsequent, elected to place a band on the left lateral hemorrhoid column. Similarly, follow-up DRE revealed band to be in excellent position of pinching or pain. Patient was discharged home without pain or other issues. Dietary and behavioral recommendations were given. Follow-up with [place] in 6 weeks to reassess.  No complications were encountered and the patient tolerated the procedure well.

## 2017-05-23 NOTE — Patient Instructions (Signed)
Avoid straining.  Continue fiber supplement daily  Limit toilet time to 51minutes  Call with any interim problems  Schedule followup appointment in 6 weeks for recheck

## 2017-05-26 ENCOUNTER — Encounter: Payer: Self-pay | Admitting: Internal Medicine

## 2017-06-26 DIAGNOSIS — K219 Gastro-esophageal reflux disease without esophagitis: Secondary | ICD-10-CM | POA: Diagnosis not present

## 2017-06-26 DIAGNOSIS — E119 Type 2 diabetes mellitus without complications: Secondary | ICD-10-CM | POA: Diagnosis not present

## 2017-06-26 DIAGNOSIS — I10 Essential (primary) hypertension: Secondary | ICD-10-CM | POA: Diagnosis not present

## 2017-07-11 DIAGNOSIS — K649 Unspecified hemorrhoids: Secondary | ICD-10-CM | POA: Diagnosis not present

## 2017-07-11 DIAGNOSIS — E119 Type 2 diabetes mellitus without complications: Secondary | ICD-10-CM | POA: Diagnosis not present

## 2017-07-11 DIAGNOSIS — I10 Essential (primary) hypertension: Secondary | ICD-10-CM | POA: Diagnosis not present

## 2017-07-15 ENCOUNTER — Ambulatory Visit: Payer: 59 | Admitting: Internal Medicine

## 2017-07-21 ENCOUNTER — Encounter: Payer: Self-pay | Admitting: Internal Medicine

## 2017-08-08 ENCOUNTER — Ambulatory Visit: Payer: 59 | Admitting: Internal Medicine

## 2017-08-29 ENCOUNTER — Ambulatory Visit: Payer: 59 | Admitting: Internal Medicine

## 2017-09-02 ENCOUNTER — Ambulatory Visit: Payer: 59 | Admitting: Internal Medicine

## 2017-09-19 ENCOUNTER — Encounter: Payer: Self-pay | Admitting: Internal Medicine

## 2017-09-19 ENCOUNTER — Ambulatory Visit (INDEPENDENT_AMBULATORY_CARE_PROVIDER_SITE_OTHER): Payer: 59 | Admitting: Internal Medicine

## 2017-09-19 VITALS — BP 128/75 | HR 51 | Temp 97.4°F | Ht 68.0 in | Wt 212.8 lb

## 2017-09-19 DIAGNOSIS — K219 Gastro-esophageal reflux disease without esophagitis: Secondary | ICD-10-CM | POA: Diagnosis not present

## 2017-09-19 DIAGNOSIS — K648 Other hemorrhoids: Secondary | ICD-10-CM

## 2017-09-19 NOTE — Patient Instructions (Signed)
Continue Fiber supplement daily  Try and loose 15 pounds between now and the end of the year.  Office visit in 1 year  Surveillance colonoscopy 2021

## 2017-09-19 NOTE — Progress Notes (Signed)
Primary Care Physician:  Sinda Du, MD Primary Gastroenterologist:  Dr. Gala Romney  Pre-Procedure History & Physical: HPI:  Jackson Morris is a 57 y.o. male here for follow-up of hemorrhoids and polyps. Underwent hemorrhoid banding 2 sessions last year.  Doing great. Patient states all his hemorrhoid symptoms are "way better" than pre-banding. He is very happy. History of multiple colonic adenomas removed last year; due for some last examination 2021. Has only occasional reflux symptoms for which he takes antaacid or 2. No dysphagia.  Past Medical History:  Diagnosis Date  . Diabetes ([place])   . GERD (gastroesophageal reflux disease)   . Headache   . HTN (hypertension)     Past Surgical History:  Procedure Laterality Date  . APPENDECTOMY    . COLONOSCOPY N/A 12/20/2016   Dr. Gala Romney: multiple polyps, tubular adenoma, internal hemorrhoids, surveillance in 3 years   . TONSILLECTOMY    . UVULECTOMY      Prior to Admission medications   Medication Sig Start Date End Date Taking? Authorizing Provider  Aspirin-Acetaminophen-Caffeine (GOODYS EXTRA STRENGTH) [phone] MG PACK Take 1 Package by mouth daily as needed.   Yes [provider]  clotrimazole-betamethasone (LOTRISONE) cream Apply 1 application topically daily as needed (skin irritation).  09/26/16  Yes [provider]  FIBER PO Take 1 tablet by mouth daily.   Yes [provider]  glimepiride (AMARYL) 4 MG tablet Take 1 tablet by mouth daily. 10/17/16  Yes [provider]  GLYXAMBI 10-5 MG TABS Take 1 tablet by mouth daily. 10/18/16  Yes [provider]  olmesartan-hydrochlorothiazide (BENICAR HCT) 40-25 MG tablet Take 1 tablet by mouth daily. 10/17/16  Yes [provider]  rosuvastatin (CRESTOR) 10 MG tablet Take 1 tablet by mouth daily. 10/17/16  Yes [provider]  acetaminophen (TYLENOL) 500 MG tablet Take 1,000 mg by mouth every 6 (six) hours as needed.    [provider]  docusate sodium (COLACE) 100 MG capsule Take 100 mg by mouth daily.    [provider]    Allergies as of 09/19/2017  . (No Known Allergies)    Family History  Problem Relation Age of Onset  . Cancer Mother   . Stroke Mother   . Diabetes Father   . Colon cancer Neg Hx     Social History   Socioeconomic History  . Marital status: Married    Spouse name: Not on file  . Number of children: Not on file  . Years of education: Not on file  . Highest education level: Not on file  Social Needs  . Financial resource strain: Not on file  . Food insecurity - worry: Not on file  . Food insecurity - inability: Not on file  . Transportation needs - medical: Not on file  . Transportation needs - non-medical: Not on file  Occupational History  . Not on file  Tobacco Use  . Smoking status: Current Every Day Smoker    Packs/day: 1.00    Years: 35.00    Pack years: 35.00    Types: E-cigarettes    Last attempt to quit: 07/08/2012    Years since quitting: 5.2  . Smokeless tobacco: Never Used  . Tobacco comment: Currently smokes e cig. Quit tobacco in 2014  Substance and Sexual Activity  . Alcohol use: Yes    Comment: occ  . Drug use: No  . Sexual activity: Not on file  Other Topics Concern  . Not on file  Social History Narrative  . Not on file    Review of Systems: See HPI, otherwise negative ROS  Physical Exam: BP 128/75   Pulse (!) 51   Temp (!) 97.4 F (36.3 C) (Oral)   Ht 5\' 8"  (1.727 m)   Wt 212 lb 12.8 oz (96.5 kg)   BMI 32.36 kg/m  General:   Alert,   pleasant and cooperative in NAD Neck:  Supple; no masses or thyromegaly. No significant cervical adenopathy. Lungs:  Clear throughout to auscultation.   No wheezes, crackles, or rhonchi. No acute distress. Heart:  Regular rate and rhythm; no murmurs, clicks, rubs,  or gallops. Abdomen: Non-distended, normal bowel sounds.  Soft and nontender without appreciable mass or hepatosplenomegaly.    Pulses:  Normal pulses noted. Extremities:  Without clubbing or edema.  Impression:  Hemorrhoid symptoms much improved status post banding. History of colonic adenoma - multiple.  Recommendations:  Continue Fiber supplement daily  Try and loose 15 pounds between now and the end of the year.  Office visit in 1 year  Surveillance colonoscopy 2021     Notice: This dictation was prepared with Dragon dictation along with smaller phrase technology. Any transcriptional errors that result from this process are unintentional and may not be corrected upon review.

## 2017-10-17 DIAGNOSIS — E785 Hyperlipidemia, unspecified: Secondary | ICD-10-CM | POA: Diagnosis not present

## 2017-10-17 DIAGNOSIS — I10 Essential (primary) hypertension: Secondary | ICD-10-CM | POA: Diagnosis not present

## 2017-10-17 DIAGNOSIS — E1169 Type 2 diabetes mellitus with other specified complication: Secondary | ICD-10-CM | POA: Diagnosis not present

## 2017-12-05 ENCOUNTER — Ambulatory Visit (HOSPITAL_COMMUNITY)
Admission: RE | Admit: 2017-12-05 | Discharge: 2017-12-05 | Disposition: A | Discharge status: Home / Self Care | Payer: 59 | Source: Ambulatory Visit | Attending: Acute Care | Admitting: Acute Care

## 2017-12-05 DIAGNOSIS — I2510 Atherosclerotic heart disease of native coronary artery without angina pectoris: Secondary | ICD-10-CM | POA: Insufficient documentation

## 2017-12-05 DIAGNOSIS — I700 Atherosclerosis of aorta: Secondary | ICD-10-CM | POA: Insufficient documentation

## 2017-12-05 DIAGNOSIS — Z122 Encounter for screening for malignant neoplasm of respiratory organs: Secondary | ICD-10-CM | POA: Diagnosis not present

## 2017-12-05 DIAGNOSIS — Z87891 Personal history of nicotine dependence: Secondary | ICD-10-CM | POA: Insufficient documentation

## 2017-12-17 ENCOUNTER — Other Ambulatory Visit: Payer: Self-pay | Admitting: Acute Care

## 2017-12-17 DIAGNOSIS — Z122 Encounter for screening for malignant neoplasm of respiratory organs: Secondary | ICD-10-CM

## 2017-12-17 DIAGNOSIS — F17210 Nicotine dependence, cigarettes, uncomplicated: Principal | ICD-10-CM

## 2018-01-12 ENCOUNTER — Encounter: Payer: Self-pay | Admitting: Internal Medicine

## 2018-01-19 DIAGNOSIS — E785 Hyperlipidemia, unspecified: Secondary | ICD-10-CM | POA: Diagnosis not present

## 2018-01-19 DIAGNOSIS — Z0000 Encounter for general adult medical examination without abnormal findings: Secondary | ICD-10-CM | POA: Diagnosis not present

## 2018-01-19 DIAGNOSIS — E1169 Type 2 diabetes mellitus with other specified complication: Secondary | ICD-10-CM | POA: Diagnosis not present

## 2018-01-19 DIAGNOSIS — I10 Essential (primary) hypertension: Secondary | ICD-10-CM | POA: Diagnosis not present

## 2018-01-30 DIAGNOSIS — I781 Nevus, non-neoplastic: Secondary | ICD-10-CM | POA: Diagnosis not present

## 2018-01-30 DIAGNOSIS — D1801 Hemangioma of skin and subcutaneous tissue: Secondary | ICD-10-CM | POA: Diagnosis not present

## 2018-01-30 DIAGNOSIS — L308 Other specified dermatitis: Secondary | ICD-10-CM | POA: Diagnosis not present

## 2018-01-30 DIAGNOSIS — Z1283 Encounter for screening for malignant neoplasm of skin: Secondary | ICD-10-CM | POA: Diagnosis not present

## 2018-01-30 DIAGNOSIS — D225 Melanocytic nevi of trunk: Secondary | ICD-10-CM | POA: Diagnosis not present

## 2018-02-23 ENCOUNTER — Ambulatory Visit (INDEPENDENT_AMBULATORY_CARE_PROVIDER_SITE_OTHER): Payer: 59 | Admitting: Otolaryngology

## 2018-02-23 DIAGNOSIS — R490 Dysphonia: Secondary | ICD-10-CM

## 2018-02-23 DIAGNOSIS — K219 Gastro-esophageal reflux disease without esophagitis: Secondary | ICD-10-CM | POA: Diagnosis not present

## 2018-04-21 DIAGNOSIS — K210 Gastro-esophageal reflux disease with esophagitis: Secondary | ICD-10-CM | POA: Diagnosis not present

## 2018-04-21 DIAGNOSIS — I10 Essential (primary) hypertension: Secondary | ICD-10-CM | POA: Diagnosis not present

## 2018-04-21 DIAGNOSIS — E1169 Type 2 diabetes mellitus with other specified complication: Secondary | ICD-10-CM | POA: Diagnosis not present

## 2018-05-04 ENCOUNTER — Ambulatory Visit (INDEPENDENT_AMBULATORY_CARE_PROVIDER_SITE_OTHER): Payer: 59 | Admitting: Otolaryngology

## 2018-07-10 DIAGNOSIS — K210 Gastro-esophageal reflux disease with esophagitis: Secondary | ICD-10-CM | POA: Diagnosis not present

## 2018-07-10 DIAGNOSIS — I10 Essential (primary) hypertension: Secondary | ICD-10-CM | POA: Diagnosis not present

## 2018-07-10 DIAGNOSIS — E1169 Type 2 diabetes mellitus with other specified complication: Secondary | ICD-10-CM | POA: Diagnosis not present

## 2018-07-22 DIAGNOSIS — M25512 Pain in left shoulder: Secondary | ICD-10-CM | POA: Diagnosis not present

## 2018-07-22 DIAGNOSIS — M25511 Pain in right shoulder: Secondary | ICD-10-CM | POA: Diagnosis not present

## 2018-07-22 DIAGNOSIS — E1165 Type 2 diabetes mellitus with hyperglycemia: Secondary | ICD-10-CM | POA: Diagnosis not present

## 2018-07-24 ENCOUNTER — Ambulatory Visit (HOSPITAL_COMMUNITY)
Admission: RE | Admit: 2018-07-24 | Discharge: 2018-07-24 | Disposition: A | Discharge status: Home / Self Care | Payer: 59 | Source: Ambulatory Visit | Attending: Pulmonary Disease | Admitting: Pulmonary Disease

## 2018-07-24 ENCOUNTER — Other Ambulatory Visit (HOSPITAL_COMMUNITY): Payer: Self-pay | Admitting: Pulmonary Disease

## 2018-07-24 DIAGNOSIS — M25512 Pain in left shoulder: Secondary | ICD-10-CM

## 2018-08-13 ENCOUNTER — Encounter: Payer: Self-pay | Admitting: Internal Medicine

## 2018-10-09 DIAGNOSIS — E1165 Type 2 diabetes mellitus with hyperglycemia: Secondary | ICD-10-CM | POA: Diagnosis not present

## 2018-10-09 DIAGNOSIS — M25512 Pain in left shoulder: Secondary | ICD-10-CM | POA: Diagnosis not present

## 2018-10-09 DIAGNOSIS — M25511 Pain in right shoulder: Secondary | ICD-10-CM | POA: Diagnosis not present

## 2018-10-20 ENCOUNTER — Other Ambulatory Visit (HOSPITAL_COMMUNITY): Payer: Self-pay | Admitting: Pulmonary Disease

## 2018-10-20 DIAGNOSIS — M25512 Pain in left shoulder: Secondary | ICD-10-CM

## 2018-10-20 DIAGNOSIS — K210 Gastro-esophageal reflux disease with esophagitis: Secondary | ICD-10-CM | POA: Diagnosis not present

## 2018-10-20 DIAGNOSIS — M25511 Pain in right shoulder: Secondary | ICD-10-CM | POA: Diagnosis not present

## 2018-10-20 DIAGNOSIS — I10 Essential (primary) hypertension: Secondary | ICD-10-CM | POA: Diagnosis not present

## 2018-11-17 ENCOUNTER — Encounter: Payer: Self-pay | Admitting: Orthopaedic Surgery

## 2018-11-17 ENCOUNTER — Other Ambulatory Visit: Payer: Self-pay

## 2018-11-17 ENCOUNTER — Ambulatory Visit (INDEPENDENT_AMBULATORY_CARE_PROVIDER_SITE_OTHER): Payer: 59 | Admitting: Orthopaedic Surgery

## 2018-11-17 VITALS — BP 128/72 | HR 48 | Temp 96.8°F | Ht 68.0 in | Wt 211.0 lb

## 2018-11-17 DIAGNOSIS — M25512 Pain in left shoulder: Secondary | ICD-10-CM | POA: Diagnosis not present

## 2018-11-17 DIAGNOSIS — G8929 Other chronic pain: Secondary | ICD-10-CM

## 2018-11-17 MED ORDER — NAPROXEN 500 MG PO TABS: 500.0000 mg | ORAL_TABLET | Freq: Two times a day (BID) | ORAL | 5 refills | Status: DC

## 2018-11-17 NOTE — Progress Notes (Signed)
Subjective:    Patient ID: Jackson Morris, male    DOB: 06-Jul-1961, 58 y.o.   MRN: [id number]  HPI He has been having pain in the left shoulder for over four months.  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  He does not relate a single incident to cause the left shoulder pain.   He has pain with overhead use. He has no redness, no swelling, no numbness.  He is not getting any better.  He has seen Dr. Luan Pulling for this and a chest problem as well.  He had x-rays of the shoulder which were negative.  He has been told of the findings.  He is concerned he still hurts.  He takes [employer] which help some.  He has pain at night when rolling over on it sleeping.  I have reviewed the notes from Dr. Luan Pulling and the x-rays and report.   Review of Systems  Constitutional: Positive for activity change.  Musculoskeletal: Positive for arthralgias.  Neurological: Positive for headaches.  All other systems reviewed and are negative.  For Review of Systems, all other systems reviewed and are negative.  The following is a summary of the past history medically, past history surgically, known current medicines, social history and family history.  This information is gathered electronically by the computer from prior information and documentation.  I review this each visit and have found including this information at this point in the chart is beneficial and informative.   Past Medical History:  Diagnosis Date  . Diabetes ([place])   . GERD (gastroesophageal reflux disease)   . Headache   . HTN (hypertension)     Past Surgical History:  Procedure Laterality Date  . APPENDECTOMY    . COLONOSCOPY N/A 12/20/2016   Dr. Gala Romney: multiple polyps, tubular adenoma, internal hemorrhoids, surveillance in 3 years   . TONSILLECTOMY    . UVULECTOMY      Current Outpatient Medications on File Prior to Visit  Medication Sig Dispense Refill  . acetaminophen (TYLENOL)  500 MG tablet Take 1,000 mg by mouth every 6 (six) hours as needed.    . Aspirin-Acetaminophen-Caffeine (GOODYS EXTRA STRENGTH) [phone] MG PACK Take 1 Package by mouth daily as needed.    . clotrimazole-betamethasone (LOTRISONE) cream Apply 1 application topically daily as needed (skin irritation).   0  . docusate sodium (COLACE) 100 MG capsule Take 100 mg by mouth daily.    Marland Kitchen FIBER PO Take 1 tablet by mouth daily.    Marland Kitchen glimepiride (AMARYL) 4 MG tablet Take 1 tablet by mouth daily.  1  . GLYXAMBI 10-5 MG TABS Take 1 tablet by mouth daily.  1  . olmesartan-hydrochlorothiazide (BENICAR HCT) 40-25 MG tablet Take 1 tablet by mouth daily.  1  . rosuvastatin (CRESTOR) 10 MG tablet Take 1 tablet by mouth daily.  1   No current facility-administered medications on file prior to visit.     Social History   Socioeconomic History  . Marital status: Married    Spouse name: Not on file  . Number of children: Not on file  . Years of education: Not on file  . Highest education level: Not on file  Occupational History  . Not on file  Social Needs  . Financial resource strain: Not on file  . Food insecurity:    Worry: Not on file    Inability: Not on file  . Transportation needs:  Medical: Not on file    Non-medical: Not on file  Tobacco Use  . Smoking status: Current Every Day Smoker    Packs/day: 1.00    Years: 35.00    Pack years: 35.00    Types: E-cigarettes    Last attempt to quit: 07/08/2012    Years since quitting: 6.3  . Smokeless tobacco: Never Used  . Tobacco comment: Currently smokes e cig. Quit tobacco in 2014  Substance and Sexual Activity  . Alcohol use: Yes    Comment: occ  . Drug use: No  . Sexual activity: Not on file  Lifestyle  . Physical activity:    Days per week: Not on file    Minutes per session: Not on file  . Stress: Not on file  Relationships  . Social connections:    Talks on phone: Not on file    Gets together: Not on file    Attends religious  service: Not on file    Active member of club or organization: Not on file    Attends meetings of clubs or organizations: Not on file    Relationship status: Not on file  . Intimate partner violence:    Fear of current or ex partner: Not on file    Emotionally abused: Not on file    Physically abused: Not on file    Forced sexual activity: Not on file  Other Topics Concern  . Not on file  Social History Narrative  . Not on file    Family History  Problem Relation Age of Onset  . Cancer Mother   . Stroke Mother   . Diabetes Father   . Colon cancer Neg Hx     BP 128/72   Pulse (!) 48   Temp (!) 96.8 F (36 C)   Ht 5\' 8"  (1.727 m)   Wt 211 lb (95.7 kg)   BMI 32.08 kg/m   Body mass index is 32.08 kg/m.     Objective:   Physical Exam Vitals signs reviewed.  Constitutional:      Appearance: He is well-developed.  HENT:     Head: Normocephalic and atraumatic.  Eyes:     Conjunctiva/sclera: Conjunctivae normal.     Pupils: Pupils are equal, round, and reactive to light.  Neck:     Musculoskeletal: Normal range of motion and neck supple.  Cardiovascular:     Rate and Rhythm: Normal rate and regular rhythm.  Pulmonary:     Effort: Pulmonary effort is normal.  Abdominal:     Palpations: Abdomen is soft.  Musculoskeletal:     Left shoulder: He exhibits decreased range of motion and tenderness.       Arms:  Skin:    General: Skin is warm and dry.  Neurological:     Mental Status: He is alert and oriented to person, place, and time.     Cranial Nerves: No cranial nerve deficit.     Motor: No abnormal muscle tone.     Coordination: Coordination normal.     Deep Tendon Reflexes: Reflexes are normal and symmetric. Reflexes normal.  Psychiatric:        Behavior: Behavior normal.        Thought Content: Thought content normal.        Judgment: Judgment normal.           Assessment & Plan:   Encounter Diagnosis  Name Primary?  . Chronic left shoulder pain  Yes   He has a MRI  of the left shoulder already scheduled by Dr. Luan Pulling in June once MRIs begin up again after closure from COVID-19.  PROCEDURE NOTE:  The patient request injection, verbal consent was obtained.  The left shoulder was prepped appropriately after time out was performed.   Sterile technique was observed and injection of 1 cc of Depo-Medrol 40 mg with several cc's of plain xylocaine. Anesthesia was provided by ethyl chloride and a 20-gauge needle was used to inject the shoulder area. A posterior approach was used.  The injection was tolerated well.  A band aid dressing was applied.  The patient was advised to apply ice later today and tomorrow to the injection sight as needed.  I have given Rx for Naprosyn 500 pc bid po  Call if any problem.  Precautions discussed.   Return in two weeks.  Electronically Signed Sanjuana Kava, MD 5/12/20208:44 AM

## 2018-12-01 ENCOUNTER — Ambulatory Visit (INDEPENDENT_AMBULATORY_CARE_PROVIDER_SITE_OTHER): Payer: 59 | Admitting: Orthopaedic Surgery

## 2018-12-01 ENCOUNTER — Encounter: Payer: Self-pay | Admitting: Orthopaedic Surgery

## 2018-12-01 ENCOUNTER — Other Ambulatory Visit: Payer: Self-pay

## 2018-12-01 VITALS — BP 144/82 | HR 51 | Temp 97.7°F | Ht 68.0 in | Wt 206.0 lb

## 2018-12-01 DIAGNOSIS — M25512 Pain in left shoulder: Secondary | ICD-10-CM | POA: Diagnosis not present

## 2018-12-01 DIAGNOSIS — G8929 Other chronic pain: Secondary | ICD-10-CM | POA: Diagnosis not present

## 2018-12-01 NOTE — Progress Notes (Signed)
Patient Jackson Morris, male DOB:1961/04/30, 58 y.o. QIH:[id number]  Chief Complaint  Patient presents with  . Shoulder Pain    Chronic left shoulder pain.    HPI  Jackson Morris is a 58 y.o. male who has chronic pain of the left shoulder.  He had good results from the injection but the pain has come back. He is scheduled for MRI on June 5.  He has no new trauma, no numbness.     Body mass index is 31.32 kg/m.  ROS  Review of Systems  Constitutional: Positive for activity change.  Musculoskeletal: Positive for arthralgias.  Neurological: Positive for headaches.  All other systems reviewed and are negative.   All other systems reviewed and are negative.  The following is a summary of the past history medically, past history surgically, known current medicines, social history and family history.  This information is gathered electronically by the computer from prior information and documentation.  I review this each visit and have found including this information at this point in the chart is beneficial and informative.    Past Medical History:  Diagnosis Date  . Diabetes ([place])   . GERD (gastroesophageal reflux disease)   . Headache   . HTN (hypertension)     Past Surgical History:  Procedure Laterality Date  . APPENDECTOMY    . COLONOSCOPY N/A 12/20/2016   Dr. Gala Romney: multiple polyps, tubular adenoma, internal hemorrhoids, surveillance in 3 years   . TONSILLECTOMY    . UVULECTOMY      Family History  Problem Relation Age of Onset  . Cancer Mother   . Stroke Mother   . Diabetes Father   . Colon cancer Neg Hx     Social History Social History   Tobacco Use  . Smoking status: Current Every Day Smoker    Packs/day: 1.00    Years: 35.00    Pack years: 35.00    Types: E-cigarettes    Last attempt to quit: 07/08/2012    Years since quitting: 6.4  . Smokeless tobacco: Never Used  . Tobacco comment: Currently smokes e cig. Quit tobacco in 2014  Substance Use  Topics  . Alcohol use: Yes    Comment: occ  . Drug use: No    No Known Allergies  Current Outpatient Medications  Medication Sig Dispense Refill  . acetaminophen (TYLENOL) 500 MG tablet Take 1,000 mg by mouth every 6 (six) hours as needed.    . Aspirin-Acetaminophen-Caffeine (GOODYS EXTRA STRENGTH) [phone] MG PACK Take 1 Package by mouth daily as needed.    . clotrimazole-betamethasone (LOTRISONE) cream Apply 1 application topically daily as needed (skin irritation).   0  . docusate sodium (COLACE) 100 MG capsule Take 100 mg by mouth daily.    Marland Kitchen FIBER PO Take 1 tablet by mouth daily.    Marland Kitchen glimepiride (AMARYL) 4 MG tablet Take 1 tablet by mouth daily.  1  . GLYXAMBI 10-5 MG TABS Take 1 tablet by mouth daily.  1  . naproxen (NAPROSYN) 500 MG tablet Take 1 tablet (500 mg total) by mouth 2 (two) times daily with a meal. 60 tablet 5  . olmesartan-hydrochlorothiazide (BENICAR HCT) 40-25 MG tablet Take 1 tablet by mouth daily.  1  . rosuvastatin (CRESTOR) 10 MG tablet Take 1 tablet by mouth daily.  1   No current facility-administered medications for this visit.      Physical Exam  Blood pressure (!) 144/82, pulse (!) 51, temperature 97.7 F (36.5 C), height  5\' 8"  (1.727 m), weight 206 lb (93.4 kg).  Constitutional: overall normal hygiene, normal nutrition, well developed, normal grooming, normal body habitus. Assistive device:none  Musculoskeletal: gait and station Limp none, muscle tone and strength are normal, no tremors or atrophy is present.  .  Neurological: coordination overall normal.  Deep tendon reflex/nerve stretch intact.  Sensation normal.  Cranial nerves II-XII intact.   Skin:   Normal overall no scars, lesions, ulcers or rashes. No psoriasis.  Psychiatric: Alert and oriented x 3.  Recent memory intact, remote memory unclear.  Normal mood and affect. Well groomed.  Good eye contact.  Cardiovascular: overall no swelling, no varicosities, no edema bilaterally, normal  temperatures of the legs and arms, no clubbing, cyanosis and good capillary refill.  Lymphatic: palpation is normal.  Left shoulder has full motion but pain in the extremes.  NV intact.  All other systems reviewed and are negative   The patient has been educated about the nature of the problem(s) and counseled on treatment options.  The patient appeared to understand what I have discussed and is in agreement with it.  Encounter Diagnosis  Name Primary?  . Chronic left shoulder pain Yes    PLAN Call if any problems.  Precautions discussed.  Continue current medications.   Return to clinic 2 weeks   Electronically Signed Sanjuana Kava, MD 5/26/20209:30 AM

## 2018-12-11 ENCOUNTER — Ambulatory Visit (HOSPITAL_COMMUNITY): Payer: 59

## 2018-12-11 ENCOUNTER — Encounter (HOSPITAL_COMMUNITY): Payer: Self-pay

## 2018-12-15 ENCOUNTER — Ambulatory Visit: Payer: 59 | Admitting: Orthopaedic Surgery

## 2019-03-30 ENCOUNTER — Telehealth: Payer: Self-pay | Admitting: Acute Care

## 2019-03-30 DIAGNOSIS — Z122 Encounter for screening for malignant neoplasm of respiratory organs: Secondary | ICD-10-CM

## 2019-03-30 DIAGNOSIS — F17210 Nicotine dependence, cigarettes, uncomplicated: Secondary | ICD-10-CM

## 2019-03-30 NOTE — Telephone Encounter (Signed)
I have this LCS CT scheduled at [facility] on 04/16/2019 @ 8:00am Wife Marzetta Board is aware of this appt and location

## 2019-03-30 NOTE — Telephone Encounter (Signed)
Jackson Morris, can you schedule this pt for f/u low dose Ct? I have placed a new order.

## 2019-04-16 ENCOUNTER — Other Ambulatory Visit: Payer: Self-pay

## 2019-04-16 ENCOUNTER — Ambulatory Visit (HOSPITAL_COMMUNITY)
Admission: RE | Admit: 2019-04-16 | Discharge: 2019-04-16 | Disposition: A | Discharge status: Home / Self Care | Payer: 59 | Source: Ambulatory Visit | Attending: Acute Care | Admitting: Acute Care

## 2019-04-16 ENCOUNTER — Encounter: Payer: Self-pay | Admitting: Internal Medicine

## 2019-04-16 ENCOUNTER — Ambulatory Visit (INDEPENDENT_AMBULATORY_CARE_PROVIDER_SITE_OTHER): Payer: 59 | Admitting: Internal Medicine

## 2019-04-16 VITALS — BP 158/81 | HR 55 | Temp 96.6°F | Ht 68.0 in | Wt 212.0 lb

## 2019-04-16 DIAGNOSIS — K219 Gastro-esophageal reflux disease without esophagitis: Secondary | ICD-10-CM

## 2019-04-16 DIAGNOSIS — Z860100 Personal history of colon polyps, unspecified: Secondary | ICD-10-CM

## 2019-04-16 DIAGNOSIS — F17200 Nicotine dependence, unspecified, uncomplicated: Secondary | ICD-10-CM | POA: Diagnosis present

## 2019-04-16 DIAGNOSIS — F17210 Nicotine dependence, cigarettes, uncomplicated: Secondary | ICD-10-CM

## 2019-04-16 DIAGNOSIS — Z122 Encounter for screening for malignant neoplasm of respiratory organs: Secondary | ICD-10-CM | POA: Diagnosis present

## 2019-04-16 MED ORDER — PANTOPRAZOLE SODIUM 40 MG PO TBEC: 40.0000 mg | DELAYED_RELEASE_TABLET | Freq: Every day | ORAL | 11 refills | Status: DC

## 2019-04-16 NOTE — Patient Instructions (Signed)
GERD information provided  Begin Protonix 40 mg daily (disp 30 with 11 refills)  Use Famotdine for flares of GERD  Schedule a surveillance TCS (hx of polyps) and screening EGD (GERD) June 2021 Propofol

## 2019-04-16 NOTE — Progress Notes (Signed)
Primary Care Physician:  Celene Squibb, MD Primary Gastroenterologist:  Dr. Gala Romney  Pre-Procedure History & Physical: HPI:  Jackson Morris is a 58 y.o. male here for follow-up of GERD and constipation.  Tells me he takes fiber supplement daily;  does well with bowel function.  History of colonic adenoma; due for surveillance colonoscopy 2021.  We discussed GERD.  He is really had quite a bit of GERD over the years.  Particularly nocturnal.  Sometimes more than 3 times weekly; present x 20 years.  Never had an upper GI tract evaluation.  Has seen ENT felt to have LPR.  Takes famotidine 20 mg twice daily with meager results.  No dysphagia. Patient states prior banding took care of his hemorrhoid problems.   Past Medical History:  Diagnosis Date  . Diabetes ([place])   . GERD (gastroesophageal reflux disease)   . Headache   . HTN (hypertension)     Past Surgical History:  Procedure Laterality Date  . APPENDECTOMY    . COLONOSCOPY N/A 12/20/2016   Dr. Gala Romney: multiple polyps, tubular adenoma, internal hemorrhoids, surveillance in 3 years   . TONSILLECTOMY    . UVULECTOMY      Prior to Admission medications   Medication Sig Start Date End Date Taking? Authorizing Provider  acetaminophen (TYLENOL) 500 MG tablet Take 1,000 mg by mouth every 6 (six) hours as needed.   Yes [provider]  Aspirin-Acetaminophen-Caffeine (GOODYS EXTRA STRENGTH) [id number] MG PACK Take 1 Package by mouth daily as needed.   Yes [provider]  clotrimazole-betamethasone (LOTRISONE) cream Apply 1 application topically daily as needed (skin irritation).  09/26/16  Yes [provider]  famotidine (PEPCID) 20 MG tablet Take 1 tablet by mouth 2 (two) times daily. 04/12/19  Yes [provider]  FIBER PO Take 1 tablet by mouth daily.   Yes [provider]  glimepiride (AMARYL) 4 MG tablet Take 1 tablet by mouth daily. 10/17/16  Yes [provider]  metFORMIN (GLUCOPHAGE)  500 MG tablet Take 1 tablet by mouth 2 (two) times daily. 04/12/19  Yes [provider]  naproxen (NAPROSYN) 500 MG tablet Take 1 tablet (500 mg total) by mouth 2 (two) times daily with a meal. 11/17/18  Yes Sanjuana Kava, MD  olmesartan-hydrochlorothiazide (BENICAR HCT) 40-25 MG tablet Take 1 tablet by mouth daily. 10/17/16  Yes [provider]  rosuvastatin (CRESTOR) 10 MG tablet Take 1 tablet by mouth daily. 10/17/16  Yes [provider]    Allergies as of 04/16/2019  . (No Known Allergies)    Family History  Problem Relation Age of Onset  . Cancer Mother   . Stroke Mother   . Diabetes Father   . Colon cancer Neg Hx     Social History   Socioeconomic History  . Marital status: Married    Spouse name: Not on file  . Number of children: Not on file  . Years of education: Not on file  . Highest education level: Not on file  Occupational History  . Not on file  Social Needs  . Financial resource strain: Not on file  . Food insecurity    Worry: Not on file    Inability: Not on file  . Transportation needs    Medical: Not on file    Non-medical: Not on file  Tobacco Use  . Smoking status: Current Every Day Smoker    Packs/day: 1.00    Years: 35.00    Pack years:  35.00    Types: E-cigarettes    Last attempt to quit: 07/08/2012    Years since quitting: 6.7  . Smokeless tobacco: Never Used  . Tobacco comment: Currently smokes e cig. Quit tobacco in 2014  Substance and Sexual Activity  . Alcohol use: Not Currently    Comment: occ  . Drug use: No  . Sexual activity: Not on file  Lifestyle  . Physical activity    Days per week: Not on file    Minutes per session: Not on file  . Stress: Not on file  Relationships  . Social Herbalist on phone: Not on file    Gets together: Not on file    Attends religious service: Not on file    Active member of club or organization: Not on file    Attends meetings of clubs or organizations: Not on  file    Relationship status: Not on file  . Intimate partner violence    Fear of current or ex partner: Not on file    Emotionally abused: Not on file    Physically abused: Not on file    Forced sexual activity: Not on file  Other Topics Concern  . Not on file  Social History Narrative  . Not on file    Review of Systems: See HPI, otherwise negative ROS  Physical Exam: BP (!) 158/81   Pulse (!) 55   Temp (!) 96.6 F (35.9 C) (Temporal)   Ht 5\' 8"  (1.727 m)   Wt 212 lb (96.2 kg)   BMI 32.23 kg/m  General:   Alert,  Well-developed, well-nourished, pleasant and cooperative in NAD Neck:  Supple; no masses or thyromegaly. No significant cervical adenopathy. Lungs:  Clear throughout to auscultation.   No wheezes, crackles, or rhonchi. No acute distress. Heart:  Regular rate and rhythm; no murmurs, clicks, rubs,  or gallops. Abdomen: Non-distended, normal bowel sounds.  Soft and nontender without appreciable mass or hepatosplenomegaly.  Pulses:  Normal pulses noted. Extremities:  Without clubbing or edema.  Impression/Plan: 58 year old gentleman with actually longstanding reflux and adequately controlled with H2 blocker therapy.  No alarm symptoms.  We talked about a multipronged approach in addressing gastroesophageal reflux disease.  He needs to have a screening EGD to assess for complications. History of multiple colonic adenomas.  Good result from prior hemorrhoid banding.  Needs a colonoscopy as schedule 2021.   Recommendations.   GERD information provided  Begin Protonix 40 mg daily (disp 30 with 11 refills)  Use Famotdine for flares of GERD  Schedule a surveillance TCS (hx of polyps) and screening EGD (GERD) June 2021 Propofol.  The risks, benefits, limitations, imponderables and alternatives regarding both EGD and colonoscopy have been reviewed with the patient. Questions have been answered. All parties agreeable.           Notice: This dictation was prepared  with Dragon dictation along with smaller phrase technology. Any transcriptional errors that result from this process are unintentional and may not be corrected upon review.

## 2019-04-21 ENCOUNTER — Telehealth: Payer: Self-pay | Admitting: Acute Care

## 2019-04-21 DIAGNOSIS — Z122 Encounter for screening for malignant neoplasm of respiratory organs: Secondary | ICD-10-CM

## 2019-04-21 DIAGNOSIS — F17210 Nicotine dependence, cigarettes, uncomplicated: Secondary | ICD-10-CM

## 2019-04-22 NOTE — Telephone Encounter (Signed)
Pt informed of CT results per Sarah Groce, NP.  PT verbalized understanding.  Copy sent to PCP.  Order placed for 1 yr f/u CT.  

## 2019-11-29 ENCOUNTER — Encounter: Payer: Self-pay | Admitting: Internal Medicine

## 2020-02-21 ENCOUNTER — Ambulatory Visit: Payer: 59

## 2020-02-22 ENCOUNTER — Other Ambulatory Visit: Payer: Self-pay

## 2020-02-22 ENCOUNTER — Ambulatory Visit (INDEPENDENT_AMBULATORY_CARE_PROVIDER_SITE_OTHER): Payer: 59 | Admitting: General Surgery

## 2020-02-22 ENCOUNTER — Encounter: Payer: Self-pay | Admitting: General Surgery

## 2020-02-22 VITALS — BP 153/77 | HR 59 | Temp 97.9°F | Resp 12 | Ht 68.0 in | Wt 205.0 lb

## 2020-02-22 DIAGNOSIS — K432 Incisional hernia without obstruction or gangrene: Secondary | ICD-10-CM | POA: Diagnosis not present

## 2020-02-22 NOTE — Patient Instructions (Signed)
Ventral Hernia  A ventral hernia is a bulge of tissue from inside the abdomen that pushes through a weak area of the muscles that form the front wall of the abdomen. The tissues inside the abdomen are inside a sac (peritoneum). These tissues include the small intestine, large intestine, and the fatty tissue that covers the intestines (omentum). Sometimes, the bulge that forms a hernia contains intestines. Other hernias contain only fat. Ventral hernias do not go away without surgical treatment. There are several types of ventral hernias. You may have:  A hernia at an incision site from previous abdominal surgery (incisional hernia).  A hernia just above the belly button (epigastric hernia), or at the belly button (umbilical hernia). These types of hernias can develop from heavy lifting or straining.  A hernia that comes and goes (reducible hernia). It may be visible only when you lift or strain. This type of hernia can be pushed back into the abdomen (reduced).  A hernia that traps abdominal tissue inside the hernia (incarcerated hernia). This type of hernia does not reduce.  A hernia that cuts off blood flow to the tissues inside the hernia (strangulated hernia). The tissues can start to die if this happens. This is a very painful bulge that cannot be reduced. A strangulated hernia is a medical emergency. What are the causes? This condition is caused by abdominal tissue putting pressure on an area of weakness in the abdominal muscles. What increases the risk? The following factors may make you more likely to develop this condition:  Being male.  Being 60 or older.  Being overweight or obese.  Having had previous abdominal surgery, especially if there was an infection after surgery.  Having had an injury to the abdominal wall.  Having had several pregnancies.  Having a buildup of fluid inside the abdomen (ascites). What are the signs or symptoms? The only symptom of a ventral hernia  may be a painless bulge in the abdomen. A reducible hernia may be visible only when you strain, cough, or lift. Other symptoms may include:  Dull pain.  A feeling of pressure. Signs and symptoms of a strangulated hernia may include:  Increasing pain.  Nausea and vomiting.  Pain when pressing on the hernia.  The skin over the hernia turning red or purple.  Constipation.  Blood in the stool (feces). How is this diagnosed? This condition may be diagnosed based on:  Your symptoms.  Your medical history.  A physical exam. You may be asked to cough or strain while standing. These actions increase the pressure inside your abdomen and force the hernia through the opening in your muscles. Your health care provider may try to reduce the hernia by pressing on it.  Imaging studies, such as an ultrasound or CT scan. How is this treated? This condition is treated with surgery. If you have a strangulated hernia, surgery is done as soon as possible. If your hernia is small and not incarcerated, you may be asked to lose some weight before surgery. Follow these instructions at home:  Follow instructions from your health care provider about eating or drinking restrictions.  If you are overweight, your health care provider may recommend that you increase your activity level and eat a healthier diet.  Do not lift anything that is heavier than 10 lb (4.5 kg).  Return to your normal activities as told by your health care provider. Ask your health care provider what activities are safe for you. You may need to avoid activities   that increase pressure on your hernia.  Take over-the-counter and prescription medicines only as told by your health care provider.  Keep all follow-up visits as told by your health care provider. This is important. Contact a health care provider if:  Your hernia gets larger.  Your hernia becomes painful. Get help right away if:  Your hernia becomes increasingly  painful.  You have pain along with any of the following: ? Changes in skin color in the area of the hernia. ? Nausea. ? Vomiting. ? Fever. Summary  A ventral hernia is a bulge of tissue from inside the abdomen that pushes through a weak area of the muscles that form the front wall of the abdomen.  This condition is treated with surgery, which may be urgent depending on your hernia.  Do not lift anything that is heavier than 10 lb (4.5 kg), and follow activity instructions from your health care provider. This information is not intended to replace advice given to you by your health care provider. Make sure you discuss any questions you have with your health care provider. Document Revised: 08/06/2017 Document Reviewed: 01/13/2017 Elsevier Patient Education  2020 [employer]  

## 2020-02-23 NOTE — H&P (Signed)
Jackson Morris; [id number]; 1961/03/30   HPI Patient is a 59 year old white male who was referred to my care by Dr. Nevada Crane for evaluation and treatment of a ventral hernia.  Patient states he has had a small swelling around his umbilicus for the past several months which is causing him more discomfort.  Is made worse with straining.  It does resolve on its own when lying down.  He is status post a laparoscopic appendectomy in the past.  He denies any nausea or vomiting.  It is affecting his work due to pain. Past Medical History:  Diagnosis Date  . Diabetes ([place])   . GERD (gastroesophageal reflux disease)   . Headache   . HTN (hypertension)     Past Surgical History:  Procedure Laterality Date  . APPENDECTOMY    . COLONOSCOPY N/A 12/20/2016   Dr. Gala Romney: multiple polyps, tubular adenoma, internal hemorrhoids, surveillance in 3 years   . TONSILLECTOMY    . UVULECTOMY      Family History  Problem Relation Age of Onset  . Cancer Mother   . Stroke Mother   . Diabetes Father   . Colon cancer Neg Hx     Current Outpatient Medications on File Prior to Visit  Medication Sig Dispense Refill  . acetaminophen (TYLENOL) 500 MG tablet Take 1,000 mg by mouth every 6 (six) hours as needed.    . Aspirin-Acetaminophen-Caffeine (GOODYS EXTRA STRENGTH) [phone] MG PACK Take 1 Package by mouth daily as needed.    . clotrimazole-betamethasone (LOTRISONE) cream Apply 1 application topically daily as needed (skin irritation).   0  . famotidine (PEPCID) 20 MG tablet Take 1 tablet by mouth 2 (two) times daily.    Marland Kitchen FIBER PO Take 1 tablet by mouth daily.    Marland Kitchen glimepiride (AMARYL) 4 MG tablet Take 1 tablet by mouth daily.  1  . metFORMIN (GLUCOPHAGE) 500 MG tablet Take 1 tablet by mouth 2 (two) times daily.    . naproxen (NAPROSYN) 500 MG tablet Take 1 tablet (500 mg total) by mouth 2 (two) times daily with a meal. 60 tablet 5  . olmesartan-hydrochlorothiazide (BENICAR HCT) 40-25 MG tablet Take 1 tablet by  mouth daily.  1  . pantoprazole (PROTONIX) 40 MG tablet Take 1 tablet (40 mg total) by mouth daily. 30 tablet 11  . rosuvastatin (CRESTOR) 10 MG tablet Take 1 tablet by mouth daily.  1  . sildenafil (REVATIO) 20 MG tablet SMARTSIG:1-3 Tablet(s) By Mouth     No current facility-administered medications on file prior to visit.    No Known Allergies  Social History   Substance and Sexual Activity  Alcohol Use Not Currently   Comment: occ    Social History   Tobacco Use  Smoking Status Current Every Day Smoker  . Packs/day: 1.00  . Years: 35.00  . Pack years: 35.00  . Types: E-cigarettes  . Last attempt to quit: 07/08/2012  . Years since quitting: 7.6  Smokeless Tobacco Never Used  Tobacco Comment   Currently smokes e cig. Quit tobacco in 2014    Review of Systems  Constitutional: Negative.   HENT: Negative.   Eyes: Negative.   Respiratory: Negative.   Cardiovascular: Negative.   Gastrointestinal: Negative.   Genitourinary: Negative.   Musculoskeletal: Negative.   Skin: Negative.   Neurological: Negative.   Endo/Heme/Allergies: Negative.   Psychiatric/Behavioral: Negative.     Objective   Vitals:   02/22/20 1137  BP: (!) 153/77  Pulse: (!) 59  Resp:  12  Temp: 97.9 F (36.6 C)  SpO2: 97%    Physical Exam Vitals reviewed.  Constitutional:      Appearance: Normal appearance. He is not ill-appearing.  HENT:     Head: Normocephalic and atraumatic.  Cardiovascular:     Rate and Rhythm: Normal rate and regular rhythm.     Heart sounds: Normal heart sounds. No murmur heard.  No friction rub. No gallop.   Pulmonary:     Effort: Pulmonary effort is normal. No respiratory distress.     Breath sounds: Normal breath sounds. No stridor. No wheezing, rhonchi or rales.  Abdominal:     General: Bowel sounds are normal. There is no distension.     Palpations: Abdomen is soft. There is no mass.     Tenderness: There is no abdominal tenderness. There is no guarding or  rebound.     Hernia: A hernia is present.     Comments: Small reducible peri-umbilical hernia below a supraumbilical surgical scar.  Skin:    General: Skin is warm and dry.  Neurological:     Mental Status: He is alert and oriented to person, place, and time.   Primary care notes reviewed  Assessment  Incisional hernia Plan   Patient is scheduled for incisional herniorrhaphy with mesh on 02/28/2020.  The risks and benefits of the procedure including bleeding, infection, mesh use, and the possibility of recurrence of the hernia were fully explained to the patient, who gave informed consent.

## 2020-02-23 NOTE — Progress Notes (Signed)
Jackson Morris; [id number]; Mar 19, 1961   HPI Patient is a 59 year old white male who was referred to my care by Dr. Nevada Crane for evaluation and treatment of a ventral hernia.  Patient states he has had a small swelling around his umbilicus for the past several months which is causing him more discomfort.  Is made worse with straining.  It does resolve on its own when lying down.  He is status post a laparoscopic appendectomy in the past.  He denies any nausea or vomiting.  It is affecting his work due to pain. Past Medical History:  Diagnosis Date  . Diabetes ([place])   . GERD (gastroesophageal reflux disease)   . Headache   . HTN (hypertension)     Past Surgical History:  Procedure Laterality Date  . APPENDECTOMY    . COLONOSCOPY N/A 12/20/2016   Dr. Gala Romney: multiple polyps, tubular adenoma, internal hemorrhoids, surveillance in 3 years   . TONSILLECTOMY    . UVULECTOMY      Family History  Problem Relation Age of Onset  . Cancer Mother   . Stroke Mother   . Diabetes Father   . Colon cancer Neg Hx     Current Outpatient Medications on File Prior to Visit  Medication Sig Dispense Refill  . acetaminophen (TYLENOL) 500 MG tablet Take 1,000 mg by mouth every 6 (six) hours as needed.    . Aspirin-Acetaminophen-Caffeine (GOODYS EXTRA STRENGTH) [phone] MG PACK Take 1 Package by mouth daily as needed.    . clotrimazole-betamethasone (LOTRISONE) cream Apply 1 application topically daily as needed (skin irritation).   0  . famotidine (PEPCID) 20 MG tablet Take 1 tablet by mouth 2 (two) times daily.    Marland Kitchen FIBER PO Take 1 tablet by mouth daily.    Marland Kitchen glimepiride (AMARYL) 4 MG tablet Take 1 tablet by mouth daily.  1  . metFORMIN (GLUCOPHAGE) 500 MG tablet Take 1 tablet by mouth 2 (two) times daily.    . naproxen (NAPROSYN) 500 MG tablet Take 1 tablet (500 mg total) by mouth 2 (two) times daily with a meal. 60 tablet 5  . olmesartan-hydrochlorothiazide (BENICAR HCT) 40-25 MG tablet Take 1 tablet by  mouth daily.  1  . pantoprazole (PROTONIX) 40 MG tablet Take 1 tablet (40 mg total) by mouth daily. 30 tablet 11  . rosuvastatin (CRESTOR) 10 MG tablet Take 1 tablet by mouth daily.  1  . sildenafil (REVATIO) 20 MG tablet SMARTSIG:1-3 Tablet(s) By Mouth     No current facility-administered medications on file prior to visit.    No Known Allergies  Social History   Substance and Sexual Activity  Alcohol Use Not Currently   Comment: occ    Social History   Tobacco Use  Smoking Status Current Every Day Smoker  . Packs/day: 1.00  . Years: 35.00  . Pack years: 35.00  . Types: E-cigarettes  . Last attempt to quit: 07/08/2012  . Years since quitting: 7.6  Smokeless Tobacco Never Used  Tobacco Comment   Currently smokes e cig. Quit tobacco in 2014    Review of Systems  Constitutional: Negative.   HENT: Negative.   Eyes: Negative.   Respiratory: Negative.   Cardiovascular: Negative.   Gastrointestinal: Negative.   Genitourinary: Negative.   Musculoskeletal: Negative.   Skin: Negative.   Neurological: Negative.   Endo/Heme/Allergies: Negative.   Psychiatric/Behavioral: Negative.     Objective   Vitals:   02/22/20 1137  BP: (!) 153/77  Pulse: (!) 59  Resp:  12  Temp: 97.9 F (36.6 C)  SpO2: 97%    Physical Exam Vitals reviewed.  Constitutional:      Appearance: Normal appearance. He is not ill-appearing.  HENT:     Head: Normocephalic and atraumatic.  Cardiovascular:     Rate and Rhythm: Normal rate and regular rhythm.     Heart sounds: Normal heart sounds. No murmur heard.  No friction rub. No gallop.   Pulmonary:     Effort: Pulmonary effort is normal. No respiratory distress.     Breath sounds: Normal breath sounds. No stridor. No wheezing, rhonchi or rales.  Abdominal:     General: Bowel sounds are normal. There is no distension.     Palpations: Abdomen is soft. There is no mass.     Tenderness: There is no abdominal tenderness. There is no guarding or  rebound.     Hernia: A hernia is present.     Comments: Small reducible peri-umbilical hernia below a supraumbilical surgical scar.  Skin:    General: Skin is warm and dry.  Neurological:     Mental Status: He is alert and oriented to person, place, and time.   Primary care notes reviewed  Assessment  Incisional hernia Plan   Patient is scheduled for incisional herniorrhaphy with mesh on 02/28/2020.  The risks and benefits of the procedure including bleeding, infection, mesh use, and the possibility of recurrence of the hernia were fully explained to the patient, who gave informed consent.

## 2020-02-23 NOTE — Patient Instructions (Addendum)
Your procedure is scheduled on: 02/28/2020  Report to [facility] at    6:15 AM.  Call this number if you have problems the morning of surgery: [phone]   Remember:   Do not Eat or Drink after midnight         No Smoking the morning of surgery  :  Take these medicines the morning of surgery with A SIP OF WATER: Protonix              No diabetic medication am of procedure   Do not wear jewelry, make-up or nail polish.  Do not wear lotions, powders, or perfumes. You may wear deodorant.  Do not shave 48 hours prior to surgery. Men may shave face and neck.  Do not bring valuables to the hospital.  Contacts, dentures or bridgework may not be worn into surgery.  Leave suitcase in the car. After surgery it may be brought to your room.  For patients admitted to the hospital, checkout time is 11:00 AM the day of discharge.   Patients discharged the day of surgery will not be allowed to drive home.    Special Instructions: Shower using CHG night before surgery and shower the day of surgery use CHG.  Use special wash - you have one bottle of CHG for all showers.  You should use approximately 1/2 of the bottle for each shower.  How to Use Chlorhexidine for Bathing Chlorhexidine gluconate (CHG) is a germ-killing (antiseptic) solution that is used to clean the skin. It can get rid of the bacteria that normally live on the skin and can keep them away for about 24 hours. To clean your skin with CHG, you may be given:  A CHG solution to use in the shower or as part of a sponge bath.  A prepackaged cloth that contains CHG. Cleaning your skin with CHG may help lower the risk for infection:  While you are staying in the intensive care unit of the hospital.  If you have a vascular access, such as a central line, to provide short-term or long-term access to your veins.  If you have a catheter to drain urine from your bladder.  If you are on a ventilator. A ventilator is a machine that helps  you breathe by moving air in and out of your lungs.  After surgery. What are the risks? Risks of using CHG include:  A skin reaction.  Hearing loss, if CHG gets in your ears.  Eye injury, if CHG gets in your eyes and is not rinsed out.  The CHG product catching fire. Make sure that you avoid smoking and flames after applying CHG to your skin. Do not use CHG:  If you have a chlorhexidine allergy or have previously reacted to chlorhexidine.  On babies younger than 24 months of age. How to use CHG solution  Use CHG only as told by your health care provider, and follow the instructions on the label.  Use the full amount of CHG as directed. Usually, this is one bottle. During a shower Follow these steps when using CHG solution during a shower (unless your health care provider gives you different instructions): 1. Start the shower. 2. Use your normal soap and shampoo to wash your face and hair. 3. Turn off the shower or move out of the shower stream. 4. Pour the CHG onto a clean washcloth. Do not use any type of brush or rough-edged sponge. 5. Starting at your neck, lather your body down  to your toes. Make sure you follow these instructions: ? If you will be having surgery, pay special attention to the part of your body where you will be having surgery. Scrub this area for at least 1 minute. ? Do not use CHG on your head or face. If the solution gets into your ears or eyes, rinse them well with water. ? Avoid your genital area. ? Avoid any areas of skin that have broken skin, cuts, or scrapes. ? Scrub your back and under your arms. Make sure to wash skin folds. 6. Let the lather sit on your skin for 1-2 minutes or as long as told by your health care provider. 7. Thoroughly rinse your entire body in the shower. Make sure that all body creases and crevices are rinsed well. 8. Dry off with a clean towel. Do not put any substances on your body afterward--such as powder, lotion, or  perfume--unless you are told to do so by your health care provider. Only use lotions that are recommended by the manufacturer. 9. Put on clean clothes or pajamas. 10. If it is the night before your surgery, sleep in clean sheets.  During a sponge bath Follow these steps when using CHG solution during a sponge bath (unless your health care provider gives you different instructions): 1. Use your normal soap and shampoo to wash your face and hair. 2. Pour the CHG onto a clean washcloth. 3. Starting at your neck, lather your body down to your toes. Make sure you follow these instructions: ? If you will be having surgery, pay special attention to the part of your body where you will be having surgery. Scrub this area for at least 1 minute. ? Do not use CHG on your head or face. If the solution gets into your ears or eyes, rinse them well with water. ? Avoid your genital area. ? Avoid any areas of skin that have broken skin, cuts, or scrapes. ? Scrub your back and under your arms. Make sure to wash skin folds. 4. Let the lather sit on your skin for 1-2 minutes or as long as told by your health care provider. 5. Using a different clean, wet washcloth, thoroughly rinse your entire body. Make sure that all body creases and crevices are rinsed well. 6. Dry off with a clean towel. Do not put any substances on your body afterward--such as powder, lotion, or perfume--unless you are told to do so by your health care provider. Only use lotions that are recommended by the manufacturer. 7. Put on clean clothes or pajamas. 8. If it is the night before your surgery, sleep in clean sheets. How to use CHG prepackaged cloths  Only use CHG cloths as told by your health care provider, and follow the instructions on the label.  Use the CHG cloth on clean, dry skin.  Do not use the CHG cloth on your head or face unless your health care provider tells you to.  When washing with the CHG cloth: ? Avoid your genital  area. ? Avoid any areas of skin that have broken skin, cuts, or scrapes. Before surgery Follow these steps when using a CHG cloth to clean before surgery (unless your health care provider gives you different instructions): 1. Using the CHG cloth, vigorously scrub the part of your body where you will be having surgery. Scrub using a back-and-forth motion for 3 minutes. The area on your body should be completely wet with CHG when you are done scrubbing. 2. Do  not rinse. Discard the cloth and let the area air-dry. Do not put any substances on the area afterward, such as powder, lotion, or perfume. 3. Put on clean clothes or pajamas. 4. If it is the night before your surgery, sleep in clean sheets.  For general bathing Follow these steps when using CHG cloths for general bathing (unless your health care provider gives you different instructions). 1. Use a separate CHG cloth for each area of your body. Make sure you wash between any folds of skin and between your fingers and toes. Wash your body in the following order, switching to a new cloth after each step: ? The front of your neck, shoulders, and chest. ? Both of your arms, under your arms, and your hands. ? Your stomach and groin area, avoiding the genitals. ? Your right leg and foot. ? Your left leg and foot. ? The back of your neck, your back, and your buttocks. 2. Do not rinse. Discard the cloth and let the area air-dry. Do not put any substances on your body afterward--such as powder, lotion, or perfume--unless you are told to do so by your health care provider. Only use lotions that are recommended by the manufacturer. 3. Put on clean clothes or pajamas. Contact a health care provider if:  Your skin gets irritated after scrubbing.  You have questions about using your solution or cloth. Get help right away if:  Your eyes become very red or swollen.  Your eyes itch badly.  Your skin itches badly and is red or swollen.  Your  hearing changes.  You have trouble seeing.  You have swelling or tingling in your mouth or throat.  You have trouble breathing.  You swallow any chlorhexidine. Summary  Chlorhexidine gluconate (CHG) is a germ-killing (antiseptic) solution that is used to clean the skin. Cleaning your skin with CHG may help to lower your risk for infection.  You may be given CHG to use for bathing. It may be in a bottle or in a prepackaged cloth to use on your skin. Carefully follow your health care provider's instructions and the instructions on the product label.  Do not use CHG if you have a chlorhexidine allergy.  Contact your health care provider if your skin gets irritated after scrubbing. This information is not intended to replace advice given to you by your health care provider. Make sure you discuss any questions you have with your health care provider. Document Revised: 09/10/2018 Document Reviewed: 05/22/2017 Elsevier Patient Education  [place] [facility], Adult, Care After These instructions give you information about caring for yourself after your procedure. Your doctor may also give you more specific instructions. If you have problems or questions, contact your doctor. Follow these instructions at home: Surgical cut (incision) care   Follow instructions from your doctor about how to take care of your surgical cut area. Make sure you: ? Wash your hands with soap and water before you change your bandage (dressing). If you cannot use soap and water, use hand sanitizer. ? Change your bandage as told by your doctor. ? Leave stitches (sutures), skin glue, or skin tape (adhesive) strips in place. They may need to stay in place for 2 weeks or longer. If tape strips get loose and curl up, you may trim the loose edges. Do not remove tape strips completely unless your doctor says it is okay.  Check your surgical cut every day for signs of infection. Check for: ? More redness,  swelling, or pain. ? More fluid or blood. ? Warmth. ? Pus or a bad smell. Activity  Do not drive or use heavy machinery while taking prescription pain medicine. Do not drive until your doctor says it is okay.  Until your doctor says it is okay: ? Do not lift anything that is heavier than 10 lb (4.5 kg). ? Do not play contact sports.  Return to your normal activities as told by your doctor. Ask your doctor what activities are safe. General instructions  To prevent or treat having a hard time pooping (constipation) while you are taking prescription pain medicine, your doctor may recommend that you: ? Drink enough fluid to keep your pee (urine) clear or pale yellow. ? Take over-the-counter or prescription medicines. ? Eat foods that are high in fiber, such as fresh fruits and vegetables, whole grains, and beans. ? Limit foods that are high in fat and processed sugars, such as fried and sweet foods.  Take over-the-counter and prescription medicines only as told by your doctor.  Do not take baths, swim, or use a hot tub until your doctor says it is okay.  Keep all follow-up visits as told by your doctor. This is important. Contact a doctor if:  You develop a rash.  You have more redness, swelling, or pain around your surgical cut.  You have more fluid or blood coming from your surgical cut.  Your surgical cut feels warm to the touch.  You have pus or a bad smell coming from your surgical cut.  You have a fever or chills.  You have blood in your poop (stool).  You have not pooped in 2-3 days.  Medicine does not help your pain. Get help right away if:  You have chest pain or you are short of breath.  You feel light-headed.  You feel weak and dizzy (feel faint).  You have very bad pain.  You throw up (vomit) and your pain is worse. This information is not intended to replace advice given to you by your health care provider. Make sure you discuss any questions you have  with your health care provider. Document Revised: 10/16/2018 Document Reviewed: 12/06/2015 Elsevier Patient Education  2020 [place] Anesthesia, Adult, Care After This sheet gives you information about how to care for yourself after your procedure. Your health care provider may also give you more specific instructions. If you have problems or questions, contact your health care provider. What can I expect after the procedure? After the procedure, the following side effects are common:  Pain or discomfort at the IV site.  Nausea.  Vomiting.  Sore throat.  Trouble concentrating.  Feeling cold or chills.  Weak or tired.  Sleepiness and fatigue.  Soreness and body aches. These side effects can affect parts of the body that were not involved in surgery. Follow these instructions at home:  For at least 24 hours after the procedure:  Have a responsible adult stay with you. It is important to have someone help care for you until you are awake and alert.  Rest as needed.  Do not: ? Participate in activities in which you could fall or become injured. ? Drive. ? Use heavy machinery. ? Drink alcohol. ? Take sleeping pills or medicines that cause drowsiness. ? Make important decisions or sign legal documents. ? Take care of children on your own. Eating and drinking  Follow any instructions from your health care provider about eating or drinking restrictions.  When you  feel hungry, start by eating small amounts of foods that are soft and easy to digest (bland), such as toast. Gradually return to your regular diet.  Drink enough fluid to keep your urine pale yellow.  If you vomit, rehydrate by drinking water, juice, or clear broth. General instructions  If you have sleep apnea, surgery and certain medicines can increase your risk for breathing problems. Follow instructions from your health care provider about wearing your sleep device: ? Anytime you are sleeping,  including during daytime naps. ? While taking prescription pain medicines, sleeping medicines, or medicines that make you drowsy.  Return to your normal activities as told by your health care provider. Ask your health care provider what activities are safe for you.  Take over-the-counter and prescription medicines only as told by your health care provider.  If you smoke, do not smoke without supervision.  Keep all follow-up visits as told by your health care provider. This is important. Contact a health care provider if:  You have nausea or vomiting that does not get better with medicine.  You cannot eat or drink without vomiting.  You have pain that does not get better with medicine.  You are unable to pass urine.  You develop a skin rash.  You have a fever.  You have redness around your IV site that gets worse. Get help right away if:  You have difficulty breathing.  You have chest pain.  You have blood in your urine or stool, or you vomit blood. Summary  After the procedure, it is common to have a sore throat or nausea. It is also common to feel tired.  Have a responsible adult stay with you for the first 24 hours after general anesthesia. It is important to have someone help care for you until you are awake and alert.  When you feel hungry, start by eating small amounts of foods that are soft and easy to digest (bland), such as toast. Gradually return to your regular diet.  Drink enough fluid to keep your urine pale yellow.  Return to your normal activities as told by your health care provider. Ask your health care provider what activities are safe for you. This information is not intended to replace advice given to you by your health care provider. Make sure you discuss any questions you have with your health care provider. Document Revised: 06/27/2017 Document Reviewed: 02/07/2017 Elsevier Patient Education  [place].

## 2020-02-24 ENCOUNTER — Other Ambulatory Visit: Payer: Self-pay

## 2020-02-24 ENCOUNTER — Encounter (HOSPITAL_COMMUNITY): Payer: Self-pay

## 2020-02-24 ENCOUNTER — Encounter (HOSPITAL_COMMUNITY)
Admission: RE | Admit: 2020-02-24 | Discharge: 2020-02-24 | Disposition: A | Discharge status: Home / Self Care | Payer: 59 | Source: Ambulatory Visit | Attending: General Surgery | Admitting: General Surgery

## 2020-02-24 DIAGNOSIS — I10 Essential (primary) hypertension: Secondary | ICD-10-CM | POA: Diagnosis not present

## 2020-02-24 DIAGNOSIS — Z01818 Encounter for other preprocedural examination: Secondary | ICD-10-CM | POA: Insufficient documentation

## 2020-02-24 HISTORY — DX: Sleep apnea, unspecified: G47.30

## 2020-02-24 LAB — BASIC METABOLIC PANEL
Anion gap: 10 (ref 5–15)
BUN: 20 mg/dL (ref 6–20)
CO2: 24 mmol/L (ref 22–32)
Calcium: 9.1 mg/dL (ref 8.9–10.3)
Chloride: 104 mmol/L (ref 98–111)
Creatinine, Ser: 1.22 mg/dL (ref 0.61–1.24)
GFR calc Af Amer: >60 mL/min (ref >60)
GFR calc non Af Amer: >60 mL/min (ref >60)
Glucose, Bld: 119 mg/dL — ABNORMAL HIGH (ref 70–99)
Potassium: 3.6 mmol/L (ref 3.5–5.1)
Sodium: 138 mmol/L (ref 135–145)

## 2020-02-24 LAB — HEMOGLOBIN A1C
Hgb A1c MFr Bld: 6.5 % — ABNORMAL HIGH (ref 4.8–5.6)
Mean Plasma Glucose: 139.85 mg/dL

## 2020-02-25 ENCOUNTER — Other Ambulatory Visit (HOSPITAL_COMMUNITY)
Admission: RE | Admit: 2020-02-25 | Discharge: 2020-02-25 | Disposition: A | Discharge status: Home / Self Care | Payer: 59 | Source: Ambulatory Visit | Attending: General Surgery | Admitting: General Surgery

## 2020-02-25 DIAGNOSIS — Z01812 Encounter for preprocedural laboratory examination: Secondary | ICD-10-CM | POA: Insufficient documentation

## 2020-02-25 DIAGNOSIS — Z20822 Contact with and (suspected) exposure to covid-19: Secondary | ICD-10-CM | POA: Diagnosis not present

## 2020-02-26 LAB — SARS CORONAVIRUS 2 (TAT 6-24 HRS): SARS Coronavirus 2: NEGATIVE

## 2020-02-28 ENCOUNTER — Encounter (HOSPITAL_COMMUNITY)
Admission: RE | Disposition: A | Discharge status: Home / Self Care | Payer: Self-pay | Source: Home / Self Care | Attending: General Surgery

## 2020-02-28 ENCOUNTER — Other Ambulatory Visit: Payer: Self-pay | Admitting: Internal Medicine

## 2020-02-28 ENCOUNTER — Ambulatory Visit (HOSPITAL_COMMUNITY): Payer: 59 | Admitting: Anesthesiology

## 2020-02-28 ENCOUNTER — Encounter (HOSPITAL_COMMUNITY): Payer: Self-pay | Admitting: General Surgery

## 2020-02-28 ENCOUNTER — Ambulatory Visit (HOSPITAL_COMMUNITY)
Admission: RE | Admit: 2020-02-28 | Discharge: 2020-02-28 | Disposition: A | Discharge status: Home / Self Care | Payer: 59 | Attending: General Surgery | Admitting: General Surgery

## 2020-02-28 DIAGNOSIS — Z7984 Long term (current) use of oral hypoglycemic drugs: Secondary | ICD-10-CM | POA: Diagnosis not present

## 2020-02-28 DIAGNOSIS — E119 Type 2 diabetes mellitus without complications: Secondary | ICD-10-CM | POA: Insufficient documentation

## 2020-02-28 DIAGNOSIS — Z79899 Other long term (current) drug therapy: Secondary | ICD-10-CM | POA: Insufficient documentation

## 2020-02-28 DIAGNOSIS — K219 Gastro-esophageal reflux disease without esophagitis: Secondary | ICD-10-CM | POA: Diagnosis not present

## 2020-02-28 DIAGNOSIS — Z833 Family history of diabetes mellitus: Secondary | ICD-10-CM | POA: Insufficient documentation

## 2020-02-28 DIAGNOSIS — Z86010 Personal history of colonic polyps: Secondary | ICD-10-CM | POA: Insufficient documentation

## 2020-02-28 DIAGNOSIS — R519 Headache, unspecified: Secondary | ICD-10-CM | POA: Diagnosis not present

## 2020-02-28 DIAGNOSIS — I10 Essential (primary) hypertension: Secondary | ICD-10-CM | POA: Insufficient documentation

## 2020-02-28 DIAGNOSIS — Z809 Family history of malignant neoplasm, unspecified: Secondary | ICD-10-CM | POA: Diagnosis not present

## 2020-02-28 DIAGNOSIS — K432 Incisional hernia without obstruction or gangrene: Secondary | ICD-10-CM | POA: Diagnosis not present

## 2020-02-28 DIAGNOSIS — Z7982 Long term (current) use of aspirin: Secondary | ICD-10-CM | POA: Diagnosis not present

## 2020-02-28 DIAGNOSIS — F17290 Nicotine dependence, other tobacco product, uncomplicated: Secondary | ICD-10-CM | POA: Diagnosis not present

## 2020-02-28 DIAGNOSIS — Z791 Long term (current) use of non-steroidal anti-inflammatories (NSAID): Secondary | ICD-10-CM | POA: Diagnosis not present

## 2020-02-28 DIAGNOSIS — F17200 Nicotine dependence, unspecified, uncomplicated: Secondary | ICD-10-CM | POA: Diagnosis not present

## 2020-02-28 DIAGNOSIS — Z823 Family history of stroke: Secondary | ICD-10-CM | POA: Insufficient documentation

## 2020-02-28 HISTORY — PX: INCISIONAL HERNIA REPAIR: SHX193

## 2020-02-28 LAB — GLUCOSE, CAPILLARY
Glucose-Capillary: 146 mg/dL — ABNORMAL HIGH (ref 70–99)
Glucose-Capillary: 134 mg/dL — ABNORMAL HIGH (ref 70–99)

## 2020-02-28 SURGERY — REPAIR, HERNIA, INCISIONAL
Anesthesia: General

## 2020-02-28 MED ORDER — ROCURONIUM 10MG/ML (10ML) SYRINGE FOR MEDFUSION PUMP - OPTIME
INTRAVENOUS | Status: DC | PRN
  Administered 2020-02-28: 40 mg via INTRAVENOUS

## 2020-02-28 MED ORDER — MIDAZOLAM HCL 5 MG/5ML IJ SOLN
INTRAMUSCULAR | Status: DC | PRN
  Administered 2020-02-28: 2 mg via INTRAVENOUS

## 2020-02-28 MED ORDER — KETOROLAC TROMETHAMINE 30 MG/ML IJ SOLN
30.0000 mg | Freq: Once | INTRAMUSCULAR | Status: AC
  Administered 2020-02-28: 30 mg via INTRAVENOUS
  Filled 2020-02-28: qty 1

## 2020-02-28 MED ORDER — ORAL CARE MOUTH RINSE: 15.0000 mL | Freq: Once | OROMUCOSAL | Status: AC

## 2020-02-28 MED ORDER — GLYCOPYRROLATE 0.2 MG/ML IJ SOLN
INTRAMUSCULAR | Status: DC | PRN
  Administered 2020-02-28: .2 mg via INTRAVENOUS

## 2020-02-28 MED ORDER — EPHEDRINE 5 MG/ML INJ
INTRAVENOUS | Status: AC
  Filled 2020-02-28: qty 10

## 2020-02-28 MED ORDER — DEXAMETHASONE SODIUM PHOSPHATE 10 MG/ML IJ SOLN
INTRAMUSCULAR | Status: AC
  Filled 2020-02-28: qty 1

## 2020-02-28 MED ORDER — PROPOFOL 10 MG/ML IV BOLUS
INTRAVENOUS | Status: DC | PRN
  Administered 2020-02-28: 200 mg via INTRAVENOUS

## 2020-02-28 MED ORDER — MIDAZOLAM HCL 2 MG/2ML IJ SOLN
INTRAMUSCULAR | Status: AC
  Filled 2020-02-28: qty 2

## 2020-02-28 MED ORDER — CEFAZOLIN SODIUM-DEXTROSE 2-4 GM/100ML-% IV SOLN
2.0000 g | INTRAVENOUS | Status: AC
  Administered 2020-02-28: 2 g via INTRAVENOUS
  Filled 2020-02-28: qty 100

## 2020-02-28 MED ORDER — ROCURONIUM BROMIDE 10 MG/ML (PF) SYRINGE
PREFILLED_SYRINGE | INTRAVENOUS | Status: AC
  Filled 2020-02-28: qty 10

## 2020-02-28 MED ORDER — FENTANYL CITRATE (PF) 100 MCG/2ML IJ SOLN
INTRAMUSCULAR | Status: DC | PRN
Start: 2020-02-28 — End: 2020-02-28
  Administered 2020-02-28 (×5): 50 ug via INTRAVENOUS

## 2020-02-28 MED ORDER — CHLORHEXIDINE GLUCONATE CLOTH 2 % EX PADS: 6.0000 | MEDICATED_PAD | Freq: Once | CUTANEOUS | Status: DC

## 2020-02-28 MED ORDER — CHLORHEXIDINE GLUCONATE 0.12 % MT SOLN
15.0000 mL | Freq: Once | OROMUCOSAL | Status: AC
  Administered 2020-02-28: 15 mL via OROMUCOSAL

## 2020-02-28 MED ORDER — LIDOCAINE 2% (20 MG/ML) 5 ML SYRINGE
INTRAMUSCULAR | Status: AC
  Filled 2020-02-28: qty 5

## 2020-02-28 MED ORDER — FENTANYL CITRATE (PF) 100 MCG/2ML IJ SOLN: 25.0000 ug | INTRAMUSCULAR | Status: DC | PRN

## 2020-02-28 MED ORDER — ONDANSETRON HCL 4 MG/2ML IJ SOLN
INTRAMUSCULAR | Status: DC | PRN
  Administered 2020-02-28: 4 mg via INTRAVENOUS

## 2020-02-28 MED ORDER — DEXAMETHASONE SODIUM PHOSPHATE 4 MG/ML IJ SOLN
INTRAMUSCULAR | Status: DC | PRN
  Administered 2020-02-28: 4 mg via INTRAVENOUS

## 2020-02-28 MED ORDER — PROPOFOL 10 MG/ML IV BOLUS
INTRAVENOUS | Status: AC
  Filled 2020-02-28: qty 40

## 2020-02-28 MED ORDER — FENTANYL CITRATE (PF) 250 MCG/5ML IJ SOLN
INTRAMUSCULAR | Status: AC
  Filled 2020-02-28: qty 5

## 2020-02-28 MED ORDER — ONDANSETRON HCL 4 MG/2ML IJ SOLN
INTRAMUSCULAR | Status: AC
  Filled 2020-02-28: qty 2

## 2020-02-28 MED ORDER — 0.9 % SODIUM CHLORIDE (POUR BTL) OPTIME
TOPICAL | Status: DC | PRN
  Administered 2020-02-28: 1000 mL

## 2020-02-28 MED ORDER — SUCCINYLCHOLINE 20MG/ML (10ML) SYRINGE FOR MEDFUSION PUMP - OPTIME
INTRAMUSCULAR | Status: DC | PRN
  Administered 2020-02-28: 160 mg via INTRAVENOUS

## 2020-02-28 MED ORDER — BUPIVACAINE LIPOSOME 1.3 % IJ SUSP
INTRAMUSCULAR | Status: AC
  Filled 2020-02-28: qty 10

## 2020-02-28 MED ORDER — SUGAMMADEX SODIUM 200 MG/2ML IV SOLN
INTRAVENOUS | Status: DC | PRN
  Administered 2020-02-28: 200 mg via INTRAVENOUS

## 2020-02-28 MED ORDER — ONDANSETRON HCL 4 MG/2ML IJ SOLN: 4.0000 mg | Freq: Once | INTRAMUSCULAR | Status: DC | PRN

## 2020-02-28 MED ORDER — GLYCOPYRROLATE PF 0.2 MG/ML IJ SOSY
PREFILLED_SYRINGE | INTRAMUSCULAR | Status: AC
  Filled 2020-02-28: qty 1

## 2020-02-28 MED ORDER — LACTATED RINGERS IV SOLN: INTRAVENOUS | Status: DC

## 2020-02-28 MED ORDER — LIDOCAINE HCL (CARDIAC) PF 50 MG/5ML IV SOSY
PREFILLED_SYRINGE | INTRAVENOUS | Status: DC | PRN
  Administered 2020-02-28: 100 mg via INTRAVENOUS

## 2020-02-28 MED ORDER — HYDROCODONE-ACETAMINOPHEN 10-325 MG PO TABS
1.0000 | ORAL_TABLET | Freq: Four times a day (QID) | ORAL | 0 refills | Status: DC | PRN
Start: 2020-02-28 — End: 2020-05-15

## 2020-02-28 MED ORDER — BUPIVACAINE LIPOSOME 1.3 % IJ SUSP
INTRAMUSCULAR | Status: DC | PRN
  Administered 2020-02-28: 20 mL

## 2020-02-28 SURGICAL SUPPLY — 46 items
ADH SKN CLS APL DERMABOND .7 (GAUZE/BANDAGES/DRESSINGS) ×1
APL PRP STRL LF DISP 70% ISPRP (MISCELLANEOUS) ×1
BLADE SURG SZ11 CARB STEEL (BLADE) ×3 IMPLANT
CHLORAPREP W/TINT 26 (MISCELLANEOUS) ×3 IMPLANT
CLOTH BEACON ORANGE TIMEOUT ST (SAFETY) ×3 IMPLANT
COVER LIGHT HANDLE STERIS (MISCELLANEOUS) ×6 IMPLANT
COVER WAND RF STERILE (DRAPES) ×3 IMPLANT
DERMABOND ADVANCED (GAUZE/BANDAGES/DRESSINGS) ×2
DERMABOND ADVANCED .7 DNX12 (GAUZE/BANDAGES/DRESSINGS) ×1 IMPLANT
ELECT REM PT RETURN 9FT ADLT (ELECTROSURGICAL) ×3
ELECTRODE REM PT RTRN 9FT ADLT (ELECTROSURGICAL) ×1 IMPLANT
GLOVE BIO SURGEON STRL SZ7.5 (GLOVE) ×3 IMPLANT
GLOVE BIOGEL M STRL SZ7.5 (GLOVE) ×3 IMPLANT
GLOVE BIOGEL PI IND STRL 7.0 (GLOVE) ×2 IMPLANT
GLOVE BIOGEL PI IND STRL 7.5 (GLOVE) ×2 IMPLANT
GLOVE BIOGEL PI INDICATOR 7.0 (GLOVE) ×4
GLOVE BIOGEL PI INDICATOR 7.5 (GLOVE) ×4
GLOVE SS BIOGEL STRL SZ 7.5 (GLOVE) ×1 IMPLANT
GLOVE SUPERSENSE BIOGEL SZ 7.5 (GLOVE) ×2
GLOVE SURG SS PI 7.5 STRL IVOR (GLOVE) ×3 IMPLANT
GOWN STRL REUS W/TWL LRG LVL3 (GOWN DISPOSABLE) ×9 IMPLANT
INST SET MAJOR GENERAL (KITS) IMPLANT
INST SET MINOR GENERAL (KITS) ×3 IMPLANT
KIT TURNOVER KIT A (KITS) ×3 IMPLANT
MANIFOLD NEPTUNE II (INSTRUMENTS) ×3 IMPLANT
MESH VENTRALEX ST 1-7/10 CRC S (Mesh General) ×3 IMPLANT
NEEDLE HYPO 21X1.5 SAFETY (NEEDLE) ×3 IMPLANT
NS IRRIG 1000ML POUR BTL (IV SOLUTION) ×3 IMPLANT
PACK MAJOR ABDOMINAL (CUSTOM PROCEDURE TRAY) IMPLANT
PACK MINOR (CUSTOM PROCEDURE TRAY) ×3 IMPLANT
PAD ARMBOARD 7.5X6 YLW CONV (MISCELLANEOUS) ×3 IMPLANT
PENCIL SMOKE EVACUATOR (MISCELLANEOUS) ×3 IMPLANT
SET BASIN LINEN APH (SET/KITS/TRAYS/PACK) ×3 IMPLANT
SUT ETHIBOND 0 MO6 C/R (SUTURE) IMPLANT
SUT ETHIBOND NAB MO 7 #0 18IN (SUTURE) ×3 IMPLANT
SUT MNCRL AB 4-0 PS2 18 (SUTURE) ×3 IMPLANT
SUT NOVA NAB GS-22 2 2-0 T-19 (SUTURE) IMPLANT
SUT PROLENE 0 CT 1 CR/8 (SUTURE) IMPLANT
SUT SILK 2 0 (SUTURE)
SUT SILK 2-0 18XBRD TIE 12 (SUTURE) IMPLANT
SUT VIC AB 2-0 CT1 27 (SUTURE) ×3
SUT VIC AB 2-0 CT1 TAPERPNT 27 (SUTURE) ×1 IMPLANT
SUT VIC AB 3-0 SH 27 (SUTURE) ×3
SUT VIC AB 3-0 SH 27X BRD (SUTURE) ×1 IMPLANT
SUT VICRYL AB 2 0 TIES (SUTURE) IMPLANT
SYR 20ML LL LF (SYRINGE) ×6 IMPLANT

## 2020-02-28 NOTE — Anesthesia Postprocedure Evaluation (Signed)
Anesthesia Post Note  Patient: Jackson Morris  Procedure(s) Performed: HERNIA REPAIR INCISIONAL WITH MESH (N/A )  Anesthesia Type: General Level of consciousness: awake, oriented, awake and alert and patient cooperative Pain management: pain level controlled Vital Signs Assessment: post-procedure vital signs reviewed and stable Respiratory status: spontaneous breathing, respiratory function stable, nonlabored ventilation and patient connected to nasal cannula oxygen Cardiovascular status: blood pressure returned to baseline and stable Postop Assessment: no headache and no backache Anesthetic complications: no   No complications documented.   Last Vitals:  Vitals:   02/28/20 0720 02/28/20 0834  BP:  (!) 161/93  Pulse: (!) 54 93  Resp: 15 20  Temp:  36.8 C  SpO2: 95% 96%    Last Pain:  Vitals:   02/28/20 0651  TempSrc: Oral  PainSc: 0-No pain                 Tacy Learn

## 2020-02-28 NOTE — Interval H&P Note (Signed)
History and Physical Interval Note:  02/28/2020 7:05 AM  Jackson Morris  has presented today for surgery, with the diagnosis of Incisional hernia.  The various methods of treatment have been discussed with the patient and family. After consideration of risks, benefits and other options for treatment, the patient has consented to  Procedure(s): HERNIA REPAIR INCISIONAL W/MESH (N/A) as a surgical intervention.  The patient's history has been reviewed, patient examined, no change in status, stable for surgery.  I have reviewed the patient's chart and labs.  Questions were answered to the patient's satisfaction.     Aviva Signs

## 2020-02-28 NOTE — Transfer of Care (Signed)
Immediate Anesthesia Transfer of Care Note  Patient: Jackson Morris  Procedure(s) Performed: HERNIA REPAIR INCISIONAL WITH MESH (N/A )  Patient Location: PACU  Anesthesia Type:General  Level of Consciousness: awake, alert , oriented and patient cooperative  Airway & Oxygen Therapy: Patient Spontanous Breathing and Patient connected to nasal cannula oxygen  Post-op Assessment: Report given to RN, Post -op Vital signs reviewed and stable and Patient moving all extremities  Post vital signs: Reviewed and stable  Last Vitals:  Vitals Value Taken Time  BP 161/93 02/28/20 0834  Temp 36.8 C 02/28/20 0834  Pulse 70 02/28/20 0836  Resp 16 02/28/20 0836  SpO2 98 % 02/28/20 0836  Vitals shown include unvalidated device data.  Last Pain:  Vitals:   02/28/20 0651  TempSrc: Oral  PainSc: 0-No pain      Patients Stated Pain Goal: 5 (83/50/75 [id number])  Complications: No complications documented.

## 2020-02-28 NOTE — Anesthesia Procedure Notes (Addendum)
Procedure Name: Intubation Performed by: Tacy Learn, CRNA Pre-anesthesia Checklist: Patient identified, Emergency Drugs available, Suction available, Patient being monitored and Timeout performed Patient Re-evaluated:Patient Re-evaluated prior to induction Oxygen Delivery Method: Circle system utilized Preoxygenation: Pre-oxygenation with 100% oxygen Induction Type: IV induction Ventilation: Mask ventilation without difficulty Laryngoscope Size: Miller and 3 Grade View: Grade I Tube size: 8.0 mm Number of attempts: 1 Airway Equipment and Method: Stylet Placement Confirmation: ETT inserted through vocal cords under direct vision,  positive ETCO2,  CO2 detector and breath sounds checked- equal and bilateral Secured at: 23 cm Tube secured with: Tape Dental Injury: Teeth and Oropharynx as per pre-operative assessment

## 2020-02-28 NOTE — Anesthesia Preprocedure Evaluation (Signed)
Anesthesia Evaluation  Patient identified by MRN, date of birth, ID band Patient awake    Reviewed: Allergy & Precautions, H&P , NPO status , Patient's Chart, lab work & pertinent test results, reviewed documented beta blocker date and time   Airway Mallampati: III  TM Distance: >3 FB Neck ROM: full    Dental no notable dental hx. (+) Teeth Intact   Pulmonary neg pulmonary ROS, Current Smoker and Patient abstained from smoking.,    Pulmonary exam normal breath sounds clear to auscultation       Cardiovascular Exercise Tolerance: Good hypertension, negative cardio ROS   Rhythm:regular Rate:Normal     Neuro/Psych  Headaches, negative psych ROS   GI/Hepatic Neg liver ROS, GERD  Medicated,  Endo/Other  negative endocrine ROSdiabetes  Renal/GU negative Renal ROS  negative genitourinary   Musculoskeletal   Abdominal   Peds  Hematology negative hematology ROS (+)   Anesthesia Other Findings   Reproductive/Obstetrics negative OB ROS                             Anesthesia Physical Anesthesia Plan  ASA: II  Anesthesia Plan: General   Post-op Pain Management:    Induction:   PONV Risk Score and Plan: 2 and Ondansetron  Airway Management Planned:   Additional Equipment:   Intra-op Plan:   Post-operative Plan:   Informed Consent: I have reviewed the patients History and Physical, chart, labs and discussed the procedure including the risks, benefits and alternatives for the proposed anesthesia with the patient or authorized representative who has indicated his/her understanding and acceptance.     Dental Advisory Given  Plan Discussed with: CRNA  Anesthesia Plan Comments:         Anesthesia Quick Evaluation

## 2020-02-28 NOTE — Discharge Instructions (Signed)
Hernia, Adult     A hernia happens when tissue inside your body pushes out through a weak spot in your belly muscles (abdominal wall). This makes a round lump (bulge). The lump may be:  In a scar from surgery that was done in your belly (incisional hernia).  Near your belly button (umbilical hernia).  In your groin (inguinal hernia). Your groin is the area where your leg meets your lower belly (abdomen). This kind of hernia could also be: ? In your scrotum, if you are male. ? In folds of skin around your vagina, if you are male.  In your upper thigh (femoral hernia).  Inside your belly (hiatal hernia). This happens when your stomach slides above the muscle between your belly and your chest (diaphragm). If your hernia is small and it does not cause pain, you may not need treatment. If your hernia is large or it causes pain, you may need surgery. Follow these instructions at home: Activity  Avoid stretching or overusing (straining) the muscles near your hernia. Straining can happen when you: ? Lift something heavy. ? Poop (have a bowel movement).  Do not lift anything that is heavier than 10 lb (4.5 kg), or the limit that you are told, until your doctor says that it is safe.  Use the strength of your legs when you lift something heavy. Do not use only your back muscles to lift. General instructions  Do these things if told by your doctor so you do not have trouble pooping (constipation): ? Drink enough fluid to keep your pee (urine) pale yellow. ? Eat foods that are high in fiber. These include fresh fruits and vegetables, whole grains, and beans. ? Limit foods that are high in fat and processed sugars. These include foods that are fried or sweet. ? Take medicine for trouble pooping.  When you cough, try to cough gently.  You may try to push your hernia in by very gently pressing on it when you are lying down. Do not try to force the bulge back in if it will not push in  easily.  If you are overweight, work with your doctor to lose weight safely.  Do not use any products that have nicotine or tobacco in them. These include cigarettes and e-cigarettes. If you need help quitting, ask your doctor.  If you will be having surgery (hernia repair), watch your hernia for changes in shape, size, or color. Tell your doctor if you see any changes.  Take over-the-counter and prescription medicines only as told by your doctor.  Keep all follow-up visits as told by your doctor. Contact a doctor if:  You get new pain, swelling, or redness near your hernia.  You poop fewer times in a week than normal.  You have trouble pooping.  You have poop (stool) that is more dry than normal.  You have poop that is harder or larger than normal. Get help right away if:  You have a fever.  You have belly pain that gets worse.  You feel sick to your stomach (nauseous).  You throw up (vomit).  Your hernia cannot be pushed in by very gently pressing on it when you are lying down. Do not try to force the bulge back in if it will not push in easily.  Your hernia: ? Changes in shape or size. ? Changes color. ? Feels hard or it hurts when you touch it. These symptoms may represent a serious problem that is an emergency. Do not   wait to see if the symptoms will go away. Get medical help right away. Call your local emergency services (911 in the [place] Summary  A hernia happens when tissue inside your body pushes out through a weak spot in the belly muscles. This creates a bulge.  If your hernia is small and it does not hurt, you may not need treatment. If your hernia is large or it hurts, you may need surgery.  If you will be having surgery, watch your hernia for changes in shape, size, or color. Tell your doctor about any changes. This information is not intended to replace advice given to you by your health care provider. Make sure you discuss any questions you have with  your health care provider. Document Revised: 10/15/2018 Document Reviewed: 03/26/2017 Elsevier Patient Education  [place] Anesthesia, Adult, Care After This sheet gives you information about how to care for yourself after your procedure. Your health care provider may also give you more specific instructions. If you have problems or questions, contact your health care provider. What can I expect after the procedure? After the procedure, the following side effects are common:  Pain or discomfort at the IV site.  Nausea.  Vomiting.  Sore throat.  Trouble concentrating.  Feeling cold or chills.  Weak or tired.  Sleepiness and fatigue.  Soreness and body aches. These side effects can affect parts of the body that were not involved in surgery. Follow these instructions at home:  For at least 24 hours after the procedure:  Have a responsible adult stay with you. It is important to have someone help care for you until you are awake and alert.  Rest as needed.  Do not: ? Participate in activities in which you could fall or become injured. ? Drive. ? Use heavy machinery. ? Drink alcohol. ? Take sleeping pills or medicines that cause drowsiness. ? Make important decisions or sign legal documents. ? Take care of children on your own. Eating and drinking  Follow any instructions from your health care provider about eating or drinking restrictions.  When you feel hungry, start by eating small amounts of foods that are soft and easy to digest (bland), such as toast. Gradually return to your regular diet.  Drink enough fluid to keep your urine pale yellow.  If you vomit, rehydrate by drinking water, juice, or clear broth. General instructions  If you have sleep apnea, surgery and certain medicines can increase your risk for breathing problems. Follow instructions from your health care provider about wearing your sleep device: ? Anytime you are sleeping,  including during daytime naps. ? While taking prescription pain medicines, sleeping medicines, or medicines that make you drowsy.  Return to your normal activities as told by your health care provider. Ask your health care provider what activities are safe for you.  Take over-the-counter and prescription medicines only as told by your health care provider.  If you smoke, do not smoke without supervision.  Keep all follow-up visits as told by your health care provider. This is important. Contact a health care provider if:  You have nausea or vomiting that does not get better with medicine.  You cannot eat or drink without vomiting.  You have pain that does not get better with medicine.  You are unable to pass urine.  You develop a skin rash.  You have a fever.  You have redness around your IV site that gets worse. Get help right away if:  You have difficulty breathing.  You have chest pain.  You have blood in your urine or stool, or you vomit blood. Summary  After the procedure, it is common to have a sore throat or nausea. It is also common to feel tired.  Have a responsible adult stay with you for the first 24 hours after general anesthesia. It is important to have someone help care for you until you are awake and alert.  When you feel hungry, start by eating small amounts of foods that are soft and easy to digest (bland), such as toast. Gradually return to your regular diet.  Drink enough fluid to keep your urine pale yellow.  Return to your normal activities as told by your health care provider. Ask your health care provider what activities are safe for you. This information is not intended to replace advice given to you by your health care provider. Make sure you discuss any questions you have with your health care provider. Document Revised: 06/27/2017 Document Reviewed: 02/07/2017 Elsevier Patient Education  [place].

## 2020-02-28 NOTE — Op Note (Signed)
Patient:  Jackson Morris  DOB:  04-30-1961  MRN:  [id number]   Preop Diagnosis: Incisional hernia  Postop Diagnosis: Same  Procedure: Incisional herniorrhaphy with mesh  Surgeon: Aviva Signs, MD  Anes: General endotracheal  Indications: Patient is a 59 year old white male who presents with an incisional hernia from a previous laparoscopic procedure.  It is at the umbilicus.  The risks and benefits of the procedure including bleeding, infection, mesh use, and the possibility of recurrence of the hernia were fully explained to the patient, who gave informed consent.  Procedure note: The patient was placed in the supine position.  After induction of general endotracheal anesthesia, the abdomen was prepped and draped using the usual sterile technique with ChloraPrep.  Surgical site confirmation was performed.  The supraumbilical incision was made through the previous surgical incision.  This was taken down to the fascia.  The umbilicus was freed away from the underlying fascia.  The hernia defect was noted to be just superior to the base the umbilicus.  This was from the previous laparoscopic procedure.  The resultant defect was approximately 1 cm in its greatest diameter.  The hernia sac as well as some properitoneal fat was excised.  I was able to get a finger into the abdominal cavity and palpated the ventral wall and no other hernia defects were noted.  There was no bowel present.  A 4.3 cm Bard Ventralax ST patch was then inserted and secured to the fascia using 0 Ethibond interrupted sutures.  The overlying fascia was closed transversely using 0 Ethibond interrupted sutures.  The base the umbilicus was secured back to the fascia using 2-0 Vicryl interrupted suture.  The subcutaneous layer was reapproximated using a 3-0 Vicryl interrupted suture.  Exparel was instilled into the surrounding wound.  The skin was closed using a 4-0 Monocryl subcuticular suture.  Dermabond was applied.  All tape  and needle counts were correct at the end of the procedure.  The patient was extubated in the operating room and transferred to PACU in stable condition.  Complications: None  EBL: Minimal  Specimen: None

## 2020-02-29 ENCOUNTER — Encounter (HOSPITAL_COMMUNITY): Payer: Self-pay | Admitting: General Surgery

## 2020-03-14 ENCOUNTER — Telehealth (INDEPENDENT_AMBULATORY_CARE_PROVIDER_SITE_OTHER): Payer: 59 | Admitting: General Surgery

## 2020-03-14 DIAGNOSIS — Z09 Encounter for follow-up examination after completed treatment for conditions other than malignant neoplasm: Secondary | ICD-10-CM

## 2020-03-14 NOTE — Progress Notes (Signed)
Subjective:     Jackson Morris  Virtual telephone visit performed.  Patient states he is doing well.  Minimal incisional pain. Objective:    There were no vitals taken for this visit.  General:  alert, cooperative and no distress       Assessment:    Doing well postoperatively.    Plan:   Patient will call office to schedule follow-up in several weeks to fill out paperwork to return to work.  May increase activity as able, but still avoid any significant heavy lifting. Total phone time 2 minutes.

## 2020-03-21 ENCOUNTER — Encounter: Payer: Self-pay | Admitting: General Surgery

## 2020-03-21 ENCOUNTER — Ambulatory Visit (INDEPENDENT_AMBULATORY_CARE_PROVIDER_SITE_OTHER): Payer: 59 | Admitting: General Surgery

## 2020-03-21 ENCOUNTER — Other Ambulatory Visit: Payer: Self-pay

## 2020-03-21 VITALS — BP 144/84 | HR 62 | Temp 97.8°F | Resp 16 | Ht 68.0 in | Wt 205.0 lb

## 2020-03-21 DIAGNOSIS — Z09 Encounter for follow-up examination after completed treatment for conditions other than malignant neoplasm: Secondary | ICD-10-CM

## 2020-03-21 NOTE — Progress Notes (Signed)
Subjective:     Jackson Morris  Here for follow-up visit, status post umbilical herniorrhaphy with mesh.  Patient doing well.  He has no complaints.  Has been increasing his activity as able. Objective:    BP (!) 144/84   Pulse 62   Temp 97.8 F (36.6 C) (Oral)   Resp 16   Ht 5\' 8"  (1.727 m)   Wt 205 lb (93 kg)   SpO2 96%   BMI 31.17 kg/m   General:  alert, cooperative and no distress  Abdomen soft, incision healing well.     Assessment:    Doing well postoperatively.    Plan:   May return to work without restrictions on 03/27/2020.  Follow-up here as needed.

## 2020-03-28 ENCOUNTER — Ambulatory Visit: Payer: 59

## 2020-05-04 ENCOUNTER — Telehealth: Payer: Self-pay | Admitting: Acute Care

## 2020-05-04 DIAGNOSIS — F17210 Nicotine dependence, cigarettes, uncomplicated: Secondary | ICD-10-CM

## 2020-05-04 NOTE — Telephone Encounter (Signed)
I have spoke with the wife Jackson Morris and explained that I will need a new LCS CT order and when I get the new order I will get the CT scheduled for Jackson Morris

## 2020-05-05 NOTE — Telephone Encounter (Signed)
New CT order has been placed.  

## 2020-05-05 NOTE — Telephone Encounter (Signed)
I have spoke with wife Erline Levine and she is aware of the appt for Jackson Morris LCS Ct scheduled at [facility] on 06/09/20 @ 9:00am

## 2020-05-15 ENCOUNTER — Ambulatory Visit (INDEPENDENT_AMBULATORY_CARE_PROVIDER_SITE_OTHER): Payer: Self-pay | Admitting: *Deleted

## 2020-05-15 VITALS — Ht 68.0 in | Wt 207.4 lb

## 2020-05-15 DIAGNOSIS — Z86010 Personal history of colonic polyps: Secondary | ICD-10-CM

## 2020-05-15 IMAGING — CT CT CHEST LUNG CANCER SCREENING LOW DOSE W/O CM
1 of 3 series · 10 of 30 positions shown, 13 images · non-contrast
Comparison: 11/22/2016 screening chest CT.

CLINICAL DATA: 57-year-old asymptomatic male current smoker with 40
pack-year smoking history.

EXAM:
CT CHEST WITHOUT CONTRAST LOW-DOSE FOR LUNG CANCER SCREENING
TECHNIQUE: Multidetector CT imaging of the chest was performed following the
standard protocol without IV contrast.

[ct lung segmentation data · axial · 0.76mm/px · z∈[+1153,+1153]mm · 10 of 303 frames shown]
[frame 1/303  mediastinal]
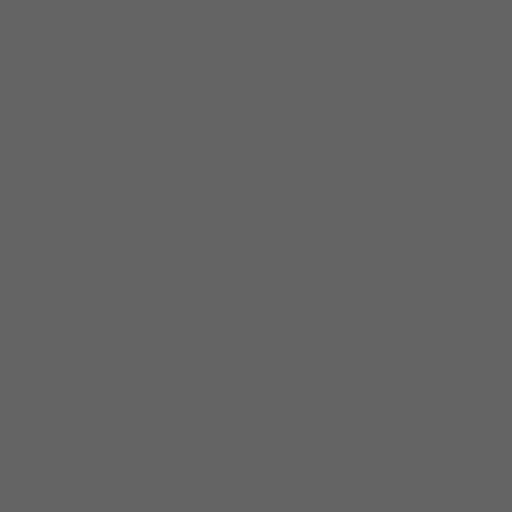
[frame 1/303  lung]
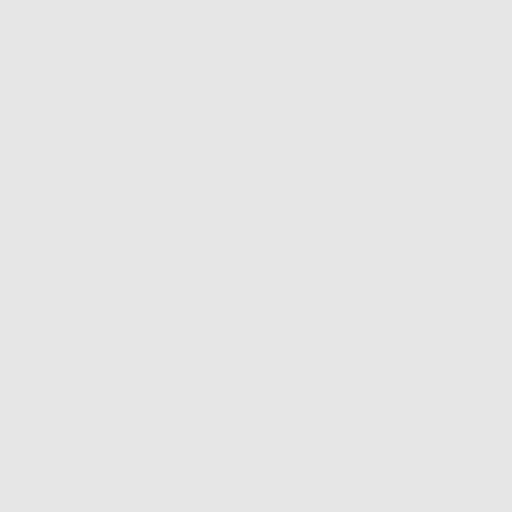
[frame 34/303  lung]
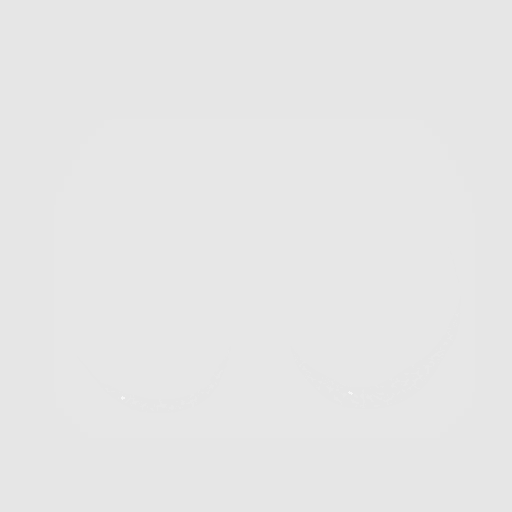
[frame 68/303  lung]
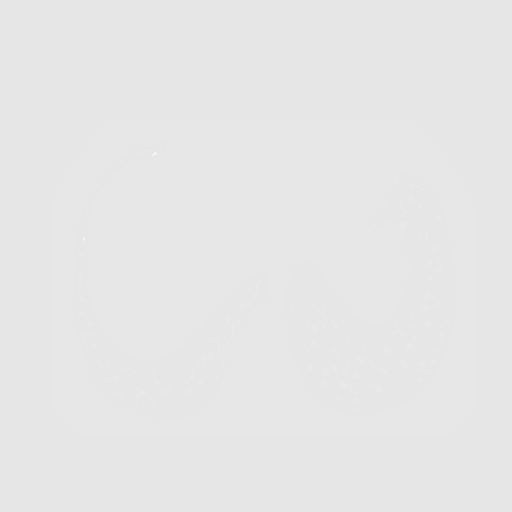
[frame 101/303  lung]
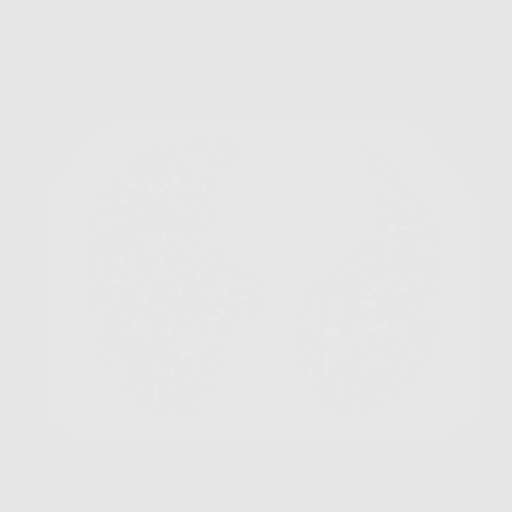
[frame 135/303  mediastinal]
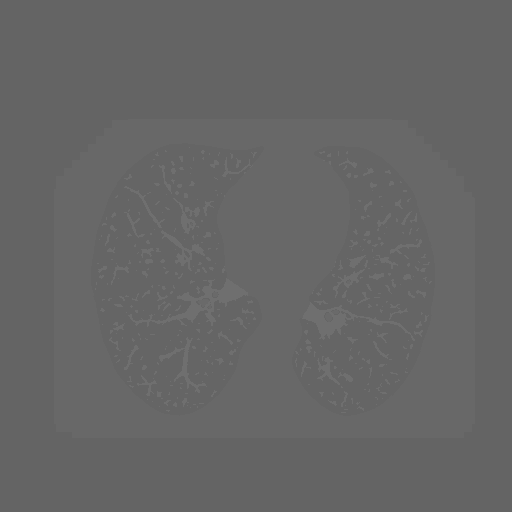
[frame 135/303  lung]
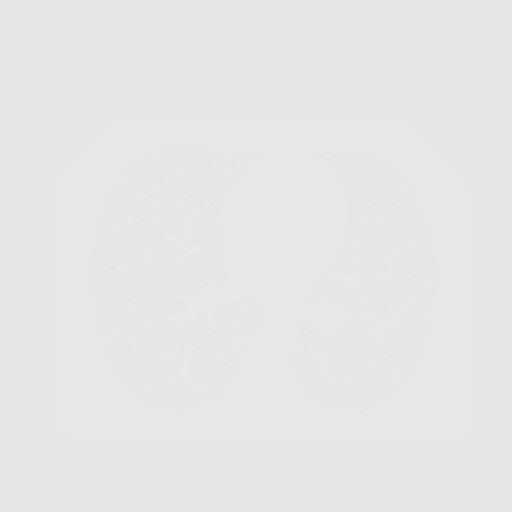
[frame 168/303  lung]
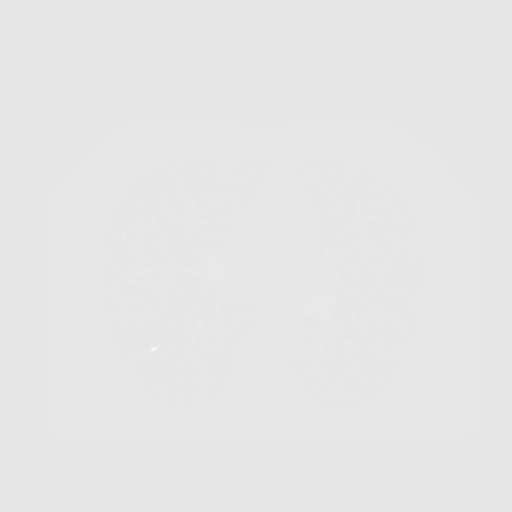
[frame 202/303  lung]
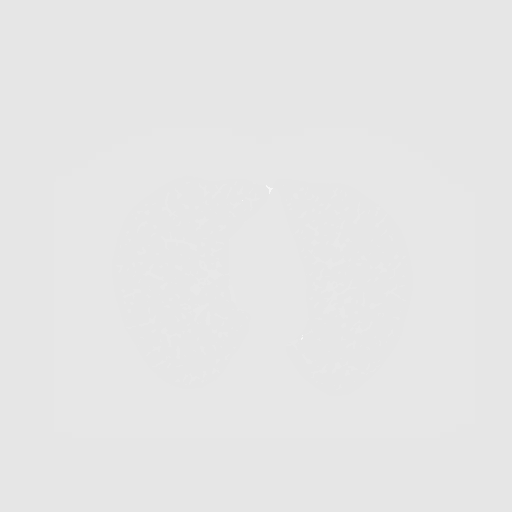
[frame 235/303  lung]
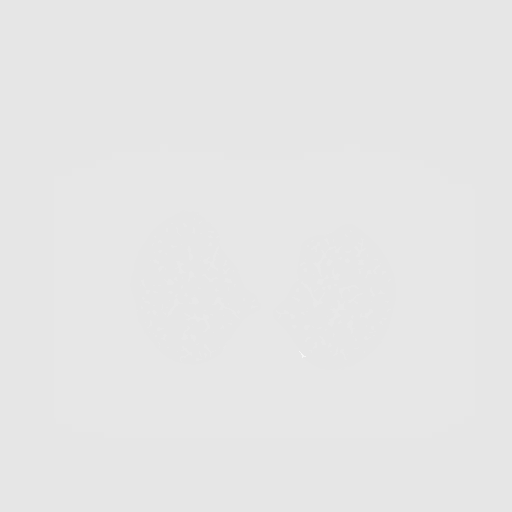
[frame 269/303  mediastinal]
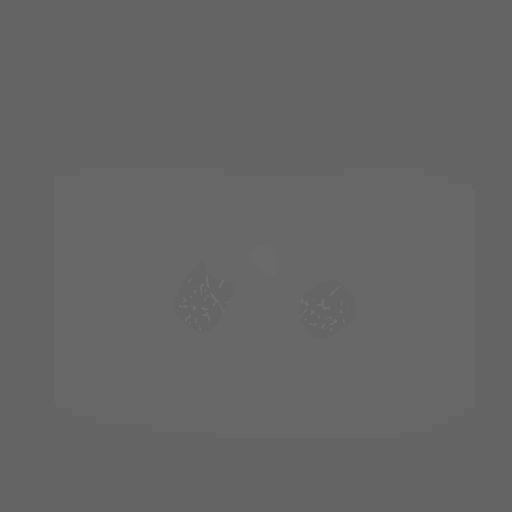
[frame 269/303  lung]
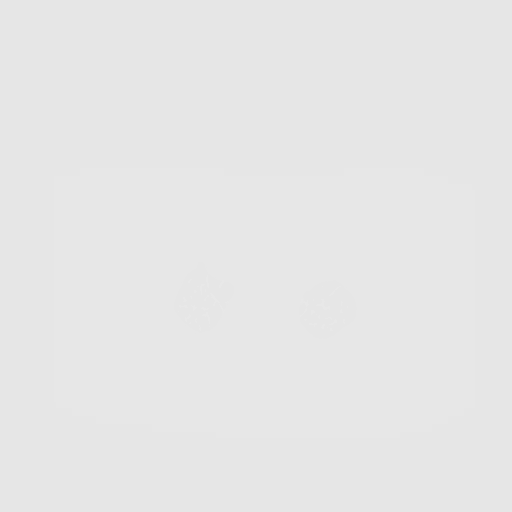
[frame 303/303  lung]
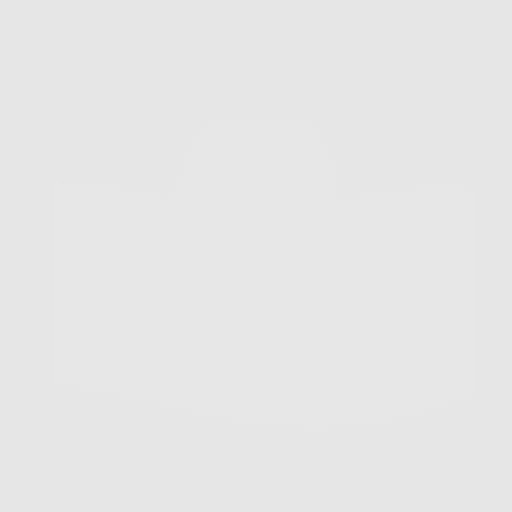

[10 of 30 positions shown; findings below may reference images not displayed]

FINDINGS: Cardiovascular: Normal heart size. No significant pericardial
effusion/thickening. Three-vessel coronary atherosclerosis.
Atherosclerotic nonaneurysmal thoracic aorta. Normal caliber
pulmonary arteries.

Mediastinum/Nodes: No discrete thyroid nodules. Unremarkable
esophagus. No pathologically enlarged axillary, mediastinal or hilar
lymph nodes, noting limited sensitivity for the detection of hilar
adenopathy on this noncontrast study.

Lungs/Pleura: No pneumothorax. No pleural effusion. Mild
centrilobular and paraseptal emphysema with mild diffuse bronchial
wall thickening. No acute consolidative airspace disease or lung
masses. No significant growth of previously visualized tiny left
upper lobe pulmonary nodule. No new significant pulmonary nodules.

Upper abdomen: No acute abnormality.

Musculoskeletal: No aggressive appearing focal osseous lesions. Mild
thoracic spondylosis.
IMPRESSION: 1. Lung-RADS 2, benign appearance or behavior. Continue annual
screening with low-dose chest CT without contrast in 12 months.
2. Three-vessel coronary atherosclerosis.

Aortic Atherosclerosis (CEUIQ-EX6.6) and Emphysema (CEUIQ-M5K.8).

## 2020-05-15 MED ORDER — NA SULFATE-K SULFATE-MG SULF 17.5-3.13-1.6 GM/177ML PO SOLN: 1.0000 | Freq: Once | ORAL | 0 refills | Status: AC

## 2020-05-15 MED ORDER — PEG 3350-KCL-NA BICARB-NACL 420 G PO SOLR: 4000.0000 mL | Freq: Once | ORAL | 0 refills | Status: DC

## 2020-05-15 NOTE — Patient Instructions (Addendum)
Please remember Covid test on 09/20/2020 at 3:40 pm.   Jackson Morris   10-25-1960 MRN: [id number] Procedure Date: 09/22/2020  Arrival Time: You will receive a call from the hospital a few days before your procedure.     Location of Procedure: [facility]  PREPARATION FOR COLONOSCOPY WITH TRI-LYTE PREP  Please notify us immediately if you are diabetic, take iron supplements, or if you are on Coumadin or any other blood thinners.   Please hold the following medications: Amaryl: half night before procedure.  None the morning of.  Metformin: None the morning of procedure.   PROCEDURE IS SCHEDULED FOR Jackson Morris AS FOLLOWS:  Procedure Date: 09/22/2020  Time to register: You will receive a call from the hospital a few days before your procedure. Place to register: [facility] Scheduled provider: Dr. Gala Romney   2 DAYS BEFORE PROCEDURE:  DATE: 09/20/2020   DAY: Monday Begin clear liquid diet AFTER your lunch meal. NO SOLID FOODS!   1 DAY BEFORE PROCEDURE:  DATE: 09/21/2020   DAY: Tuesday  Continue clear liquids the entire day - NO SOLID FOOD.   Diabetic medications adjustments for today: Amaryl: half night before procedure.  At 12:00pm (noon): Take 2 (two) Dulcolax (Bisacodyl) tablets  At 2:00pm: Start drinking your solution. Try to drink 1 (one) 8 ounce glass every 10-15 minutes, until you have consumed HALF the jug. (You should complete the first 1/2 of the jug in 2 hours. Wait 30 minutes, then drink 3-4 more glasses of the solution. Your stools should be clear; if not, you may have to consume the rest of the jug.   One hour after completing the solution: take the last 2 (two) Dulcolax (Bisacodyl) tablets, with a clear liquid.  YOU MUST DRINK PLENTY OF CLEAR LIQUIDS DURING YOUR PREP TO REDUCE RISKS OF KIDNEY FAILURE.   Continue clear liquids only, until midnight. Do not eat or drink anything after midnight.  EXCEPTION:  If you take medications for your heart, blood pressure  or breathing, you may take these medications with a small amount of clear liquid.      DAY OF PROCEDURE:   DATE: 09/22/2020       DAY: Wednesday The morning of your procedure give yourself 1 (one) Fleet Enema, at least 1 hour before going to the hospital.   You may take Tylenol products. Please continue your regular medications unless we have instructed otherwise.   Diabetic medications adjustments for today. No Metformin or Amaryl the morning of procedure.  Someone MUST be available to drive you home; the hospital will cancel this appointment if you do not have a driver.   Please call the office if you have any questions (Dept: [phone]).  Please see below for Dietary Information.  CLEAR LIQUIDS INCLUDE:  Water Jello (NOT red in color)   Ice Popsicles (NOT red in color)   Tea (sugar ok, no milk/cream) Powdered fruit flavored drinks  Coffee (sugar ok, no milk/cream) Gatorade/ Lemonade/ Kool-Aid  (NOT red in color)   Juice: apple, white grape, white cranberry Soft drinks  Clear bullion, consomme, broth (fat free beef/chicken/vegetable)  Carbonated beverages (any kind)  Strained chicken noodle soup Hard Candy   REMEMBER: Clear liquids are liquids that will allow you to see your fingers on the other side of a clear glass. Be sure liquids are NOT red in color, and not cloudy, but CLEAR.   DO NOT EAT OR DRINK ANY OF THE FOLLOWING:  Dairy products of  any kind   Cranberry juice Tomato juice / V8 juice   Grapefruit juice Orange juice     Red grape juice  Do not eat any solid foods, including such foods as: cereal, oatmeal, yogurt, fruits, vegetables, creamed soups, eggs, bread, etc.    HELPFUL HINTS FOR DRINKING PREP SOLUTION:   Make sure prep is extremely cold. Refrigerate the night before. You may also put in the freezer.   You may try mixing some Crystal Light or Country Time Lemonade if you prefer. Mix in small amounts; add more if necessary.  Try drinking through a  straw  Rinse mouth with water or a mouthwash between glasses, to remove after-taste.  Try sipping on a cold beverage /ice/ popsicles between glasses of prep  Place a piece of sugar-free hard candy in mouth between glasses  If you become nauseated, try consuming smaller amounts, or stretch out the time between glasses. Stop for 30-60 minutes, then slowly start back drinking    You may call the office (Dept: [phone]) before 5:00pm, or page the doctor on call after 5:00pm ([phone]), for further instructions, if necessary.   OTHER INSTRUCTIONS  You will need a responsible adult at least 59 years of age to accompany you and drive you home. This person must remain in the waiting room during your procedure.  Wear loose fitting clothing that is easily removed.  Leave jewelry and other valuables at home.   Remove all body piercing jewelry and leave at home.  Total time from sign-in until discharge is approximately 2-3 hours.  You should go home directly after your procedure and rest. You can resume normal activities the day after your procedure.  The day of your procedure you should not:  Drive  Make legal decisions  Operate machinery  Drink alcohol  Return to work

## 2020-05-15 NOTE — Progress Notes (Addendum)
Gastroenterology Pre-Procedure Review  Request Date: 05/15/2020 Requesting Physician: 3 year recall, Last TCS done 12/20/2016 by Dr. Gala Romney, tubular adenoma, hyperplastic polyp  PATIENT REVIEW QUESTIONS: The patient responded to the following health history questions as indicated:    1. Diabetes Melitis: yes, type II 2. Joint replacements in the past 12 months: no 3. Major health problems in the past 3 months: yes, hernia surgery (Dr. Arnoldo Morale) 4. Has an artificial valve or MVP: no 5. Has a defibrillator:  6. Has been advised in past to take antibiotics in advance of a procedure like teeth cleaning: no 7. Family history of colon cancer: no  8. Alcohol Use: no 9. Illicit drug Use: no 10. History of sleep apnea: yes, not on CPAP machine 11. History of coronary artery or other vascular stents placed within the last 12 months: no 12. History of any prior anesthesia complications: no 13. Body mass index is 31.54 kg/m.    MEDICATIONS & ALLERGIES:    Patient reports the following regarding taking any blood thinners:   Plavix? no Aspirin? yes, good powder daily Coumadin? no Brilinta? no Xarelto? no Eliquis? no Pradaxa? no Savaysa? no Effient? no  Patient confirms/reports the following medications:  Current Outpatient Medications  Medication Sig Dispense Refill  . acetaminophen (TYLENOL) 500 MG tablet Take 1,000 mg by mouth as needed for moderate pain.     . Aspirin-Acetaminophen-Caffeine (GOODYS EXTRA STRENGTH) [phone] MG PACK Take 1 Package by mouth in the morning.     . clotrimazole-betamethasone (LOTRISONE) cream Apply 1 application topically daily as needed (eczema).   0  . FIBER PO Take 1 tablet by mouth daily.    Marland Kitchen glimepiride (AMARYL) 4 MG tablet Take 4 mg by mouth in the morning and at bedtime.   1  . MAGNESIUM PO Take 1 tablet by mouth daily.    . metFORMIN (GLUCOPHAGE) 500 MG tablet Take 500 mg by mouth 2 (two) times daily.     Marland Kitchen olmesartan-hydrochlorothiazide (BENICAR  HCT) 40-25 MG tablet Take 1 tablet by mouth daily.  1  . pantoprazole (PROTONIX) 40 MG tablet TAKE 1 TABLET BY MOUTH EVERY DAY 90 tablet 3  . rosuvastatin (CRESTOR) 10 MG tablet Take 10 mg by mouth daily.   1   No current facility-administered medications for this visit.    Patient confirms/reports the following allergies:  No Known Allergies  No orders of the defined types were placed in this encounter.   AUTHORIZATION INFORMATION Primary Insurance: [place],  [place] #:[id number],  Group #: [id number] Pre-Cert / Josem Kaufmann required: Yes, approved online 71/12/[id number]/6270 Pre-Cert / Josem Kaufmann #: [id number]  SCHEDULE INFORMATION: Procedure has been scheduled as follows:  Date: 07/05/2020, Time: 9:30 Location: [facility] with Dr. Gala Romney  This Gastroenterology Pre-Precedure Review Form is being routed to the following provider(s): Walden Field, NP

## 2020-05-23 ENCOUNTER — Encounter: Payer: Self-pay | Admitting: *Deleted

## 2020-05-23 NOTE — Progress Notes (Signed)
Ok to schedule.  ASA II 

## 2020-05-23 NOTE — Progress Notes (Signed)
Metformin: None the morning of  Amaryl: half night before, none morning of.  On prep day: Check CBG ac and hs as well (if they normally check their blood sugar) as if the patient feels like their blood sugar is off. Can use soda, juice (that's in [place]) as needed for any low blood sugar.  Check CBG on arrival to endo unit.

## 2020-05-23 NOTE — Progress Notes (Signed)
Mailed letter to patient with diabetes medication adjustments.

## 2020-05-24 ENCOUNTER — Other Ambulatory Visit: Payer: Self-pay | Admitting: *Deleted

## 2020-06-09 ENCOUNTER — Ambulatory Visit (HOSPITAL_COMMUNITY)
Admission: RE | Admit: 2020-06-09 | Discharge: 2020-06-09 | Disposition: A | Discharge status: Home / Self Care | Payer: 59 | Source: Ambulatory Visit | Attending: Acute Care | Admitting: Acute Care

## 2020-06-09 ENCOUNTER — Other Ambulatory Visit: Payer: Self-pay

## 2020-06-09 DIAGNOSIS — F17210 Nicotine dependence, cigarettes, uncomplicated: Secondary | ICD-10-CM | POA: Diagnosis present

## 2020-06-12 ENCOUNTER — Telehealth: Payer: Self-pay | Admitting: Internal Medicine

## 2020-06-12 NOTE — Telephone Encounter (Signed)
Pt wants to reschedule procedure to the end of January. [phone]

## 2020-06-13 ENCOUNTER — Encounter: Payer: Self-pay | Admitting: *Deleted

## 2020-06-13 NOTE — Telephone Encounter (Signed)
Spoke to wife (listed on DPR)  and she requested to reschedule husband's procedure.  Rescheduled Covid screening to 07/31/2020 at 3:25.  Rescheduled procedure to 08/02/2020 at 1:45 with arrival at 12:45.  Pt aware that I am mailing out new instructions.  Called and made Engelhard in Endo aware.

## 2020-06-13 NOTE — Telephone Encounter (Signed)
Tried to call pt back but his phone rings once and then stops.

## 2020-06-13 NOTE — Progress Notes (Signed)

## 2020-06-15 ENCOUNTER — Telehealth: Payer: Self-pay | Admitting: Acute Care

## 2020-06-15 DIAGNOSIS — Z87891 Personal history of nicotine dependence: Secondary | ICD-10-CM

## 2020-06-15 NOTE — Telephone Encounter (Signed)
Pt's wife (DPR)  informed of CT results per Eric Form, NP.  She verbalized understanding.  Copy sent to PCP.  Order placed for 1 yr f/u CT.

## 2020-07-04 ENCOUNTER — Other Ambulatory Visit (HOSPITAL_COMMUNITY): Payer: 59

## 2020-07-26 ENCOUNTER — Telehealth: Payer: Self-pay | Admitting: Internal Medicine

## 2020-07-26 NOTE — Telephone Encounter (Signed)
Pt's wife, Erline Levine, called to reschedule patient's procedure on 08/02/2020 with Dr Gala Romney. Please call her at [phone]

## 2020-07-26 NOTE — Telephone Encounter (Signed)
Called pt back but had to [facility].

## 2020-07-28 ENCOUNTER — Other Ambulatory Visit (HOSPITAL_COMMUNITY): Payer: Self-pay | Admitting: *Deleted

## 2020-07-31 ENCOUNTER — Other Ambulatory Visit (HOSPITAL_COMMUNITY)
Admission: RE | Admit: 2020-07-31 | Discharge: 2020-07-31 | Disposition: A | Discharge status: Home / Self Care | Payer: 59 | Source: Ambulatory Visit | Attending: Internal Medicine | Admitting: Internal Medicine

## 2020-07-31 NOTE — Telephone Encounter (Addendum)
Spoke with pt and his wife.  Informed them that we could get him rescheduled for some time in March once our procedure schedules have been released.  They voiced understanding.  Informed Hoyle Sauer in Endo by voice mail.

## 2020-08-02 ENCOUNTER — Encounter (HOSPITAL_COMMUNITY): Admission: RE | Payer: Self-pay | Source: Home / Self Care

## 2020-08-02 ENCOUNTER — Ambulatory Visit (HOSPITAL_COMMUNITY): Admission: RE | Admit: 2020-08-02 | Payer: 59 | Source: Home / Self Care | Admitting: Internal Medicine

## 2020-08-02 SURGERY — COLONOSCOPY
Anesthesia: Moderate Sedation

## 2020-08-07 ENCOUNTER — Encounter: Payer: Self-pay | Admitting: *Deleted

## 2020-08-07 NOTE — Telephone Encounter (Addendum)
Spoke with wife (listed on [place]).  She rescheduled pt's covid screening to 09/20/2020 at 3:40.  She rescheduled his procedure to 09/22/2020.  She was made aware that Day Surgery will call him 3 days prior to his procedure regarding time. Pt's wife informed me that they have Trilyte prep kit at home.  Informed her that pt should now drink all the solution the day before procedure.  Pt's wife confirmed address with me and is aware that I will mail out new instructions and diabetes medication adjustments.  She voiced understanding.

## 2020-08-25 ENCOUNTER — Encounter: Payer: Self-pay | Admitting: Cardiology

## 2020-08-25 ENCOUNTER — Encounter: Payer: Self-pay | Admitting: *Deleted

## 2020-08-25 ENCOUNTER — Telehealth: Payer: Self-pay | Admitting: Cardiology

## 2020-08-25 ENCOUNTER — Ambulatory Visit (INDEPENDENT_AMBULATORY_CARE_PROVIDER_SITE_OTHER): Payer: 59 | Admitting: Cardiology

## 2020-08-25 ENCOUNTER — Other Ambulatory Visit: Payer: Self-pay

## 2020-08-25 VITALS — BP 150/92 | HR 62 | Ht 68.0 in | Wt 204.8 lb

## 2020-08-25 DIAGNOSIS — I2510 Atherosclerotic heart disease of native coronary artery without angina pectoris: Secondary | ICD-10-CM | POA: Diagnosis not present

## 2020-08-25 DIAGNOSIS — I10 Essential (primary) hypertension: Secondary | ICD-10-CM

## 2020-08-25 NOTE — Patient Instructions (Addendum)
Medication Instructions:   Your physician recommends that you continue on your current medications as directed. Please refer to the Current Medication list given to you today.  Labwork:  Covid test 2-3 days before stress test  Testing/Procedures: Your physician has requested that you have en exercise stress myoview. For further information please visit [email or web address]. Please follow instruction sheet, as given.  Follow-Up:  Your physician recommends that you schedule a follow-up appointment in: pending.   Any Other Special Instructions Will Be Listed Below (If Applicable).  If you need a refill on your cardiac medications before your next appointment, please call your pharmacy.

## 2020-08-25 NOTE — Telephone Encounter (Signed)
Pre-cert Verification for the following procedure    EXERCISE STRESS MYOVIEW  DATE:  09/11/2020  LOCATION: [facility]

## 2020-08-25 NOTE — Progress Notes (Signed)
Clinical Summary Mr. Tiegs is a 60 y.o.male seen today as a new consult, referred by Dr Nevada Crane for the following medical problems.  1. Coronary atherosclerosis 06/2020 CT: mild atherosclerosis of aortic arch, LAD, RCA.  - nonspecific chest pain he relates to heavy lifiting at work causing muscle soreness - works as Clinical biochemist, does heavy lifting and walking up stairs without significant symptoms - denies any exertional symptoms   CAD risk factors: DM2, HTN, HL, tobacco x 40 years. Father MI 72. Brother MI 18s.    Past Medical History:  Diagnosis Date  . Diabetes ([place])   . GERD (gastroesophageal reflux disease)   . Headache   . HTN (hypertension)   . Sleep apnea      No Known Allergies   Current Outpatient Medications  Medication Sig Dispense Refill  . acetaminophen (TYLENOL) 500 MG tablet Take 1,000 mg by mouth as needed for moderate pain.     . Aspirin-Acetaminophen-Caffeine (GOODYS EXTRA STRENGTH) [phone] MG PACK Take 1 Package by mouth in the morning.     . clotrimazole-betamethasone (LOTRISONE) cream Apply 1 application topically daily as needed (eczema).   0  . FIBER PO Take 1 tablet by mouth daily.    Marland Kitchen glimepiride (AMARYL) 4 MG tablet Take 4 mg by mouth in the morning and at bedtime.   1  . MAGNESIUM PO Take 1 tablet by mouth daily.    . metFORMIN (GLUCOPHAGE) 500 MG tablet Take 500 mg by mouth 2 (two) times daily.     Marland Kitchen olmesartan-hydrochlorothiazide (BENICAR HCT) 40-25 MG tablet Take 1 tablet by mouth daily.  1  . pantoprazole (PROTONIX) 40 MG tablet TAKE 1 TABLET BY MOUTH EVERY DAY 90 tablet 3  . rosuvastatin (CRESTOR) 10 MG tablet Take 10 mg by mouth daily.   1   No current facility-administered medications for this visit.     Past Surgical History:  Procedure Laterality Date  . APPENDECTOMY    . COLONOSCOPY N/A 12/20/2016   Dr. Gala Romney: multiple polyps, tubular adenoma, internal hemorrhoids, surveillance in 3 years   . INCISIONAL HERNIA REPAIR N/A  02/28/2020   Procedure: HERNIA REPAIR INCISIONAL WITH MESH;  Surgeon: Aviva Signs, MD;  Location: [facility];  Service: General;  Laterality: N/A;  . TONSILLECTOMY    . UVULECTOMY       No Known Allergies    Family History  Problem Relation Age of Onset  . Cancer Mother   . Stroke Mother   . Diabetes Father   . Colon cancer Neg Hx      Social History Mr. Matsuoka reports that he has been smoking e-cigarettes. He has a 35.00 pack-year smoking history. He has never used smokeless tobacco. Mr. Resurreccion reports previous alcohol use.   Review of Systems CONSTITUTIONAL: No weight loss, fever, chills, weakness or fatigue.  HEENT: Eyes: No visual loss, blurred vision, double vision or yellow sclerae.No hearing loss, sneezing, congestion, runny nose or sore throat.  SKIN: No rash or itching.  CARDIOVASCULAR: per hpi RESPIRATORY: No shortness of breath, cough or sputum.  GASTROINTESTINAL: No anorexia, nausea, vomiting or diarrhea. No abdominal pain or blood.  GENITOURINARY: No burning on urination, no polyuria NEUROLOGICAL: No headache, dizziness, syncope, paralysis, ataxia, numbness or tingling in the extremities. No change in bowel or bladder control.  MUSCULOSKELETAL: No muscle, back pain, joint pain or stiffness.  LYMPHATICS: No enlarged nodes. No history of splenectomy.  PSYCHIATRIC: No history of depression or anxiety.  ENDOCRINOLOGIC: No reports of sweating,  cold or heat intolerance. No polyuria or polydipsia.  Marland Kitchen   Physical Examination Today's Vitals   08/25/20 1455  BP: (!) 150/92  Pulse: 62  SpO2: 98%  Weight: 204 lb 12.8 oz (92.9 kg)  Height: 5\' 8"  (1.727 m)   Body mass index is 31.14 kg/m.  Gen: resting comfortably, no acute distress HEENT: no scleral icterus, pupils equal round and reactive, no palptable cervical adenopathy,  CV: RRR, no m/r/g, no jvd Resp: Clear to auscultation bilaterally GI: abdomen is soft, non-tender, non-distended, normal bowel sounds, no  hepatosplenomegaly MSK: extremities are warm, no edema.  Skin: warm, no rash Neuro:  no focal deficits Psych: appropriate affect      Assessment and Plan  1. Coronary artery atherosclerosis - noted by recent CT scan - multiple significant CAD risk factors - will obtain an exercise nuclear stress test for further evaluation and risk stratification - EKG today shows NSR, no ischemic changes  F/u pending stress findings. If negative just f/u as needed, risk factor modification per pcp      Arnoldo Lenis, M.D.

## 2020-08-31 ENCOUNTER — Encounter: Payer: Self-pay | Admitting: Cardiology

## 2020-09-08 ENCOUNTER — Other Ambulatory Visit (HOSPITAL_COMMUNITY)
Admission: RE | Admit: 2020-09-08 | Discharge: 2020-09-08 | Disposition: A | Discharge status: Home / Self Care | Payer: 59 | Source: Ambulatory Visit | Attending: Internal Medicine | Admitting: Internal Medicine

## 2020-09-11 ENCOUNTER — Encounter (HOSPITAL_COMMUNITY): Payer: 59

## 2020-09-11 ENCOUNTER — Ambulatory Visit (HOSPITAL_COMMUNITY): Payer: 59

## 2020-09-20 ENCOUNTER — Other Ambulatory Visit: Payer: Self-pay

## 2020-09-20 ENCOUNTER — Other Ambulatory Visit (HOSPITAL_COMMUNITY)
Admission: RE | Admit: 2020-09-20 | Discharge: 2020-09-20 | Disposition: A | Discharge status: Home / Self Care | Payer: 59 | Source: Ambulatory Visit | Attending: Internal Medicine | Admitting: Internal Medicine

## 2020-09-20 DIAGNOSIS — Z20822 Contact with and (suspected) exposure to covid-19: Secondary | ICD-10-CM | POA: Diagnosis not present

## 2020-09-20 DIAGNOSIS — Z01812 Encounter for preprocedural laboratory examination: Secondary | ICD-10-CM | POA: Diagnosis present

## 2020-09-20 LAB — SARS CORONAVIRUS 2 (TAT 6-24 HRS): SARS Coronavirus 2: NEGATIVE

## 2020-09-22 ENCOUNTER — Other Ambulatory Visit: Payer: Self-pay

## 2020-09-22 ENCOUNTER — Encounter (HOSPITAL_COMMUNITY)
Admission: RE | Disposition: A | Discharge status: Home / Self Care | Payer: Self-pay | Source: Home / Self Care | Attending: Internal Medicine

## 2020-09-22 ENCOUNTER — Ambulatory Visit (HOSPITAL_COMMUNITY)
Admission: RE | Admit: 2020-09-22 | Discharge: 2020-09-22 | Disposition: A | Discharge status: Home / Self Care | Payer: 59 | Attending: Internal Medicine | Admitting: Internal Medicine

## 2020-09-22 ENCOUNTER — Encounter (HOSPITAL_COMMUNITY): Payer: Self-pay | Admitting: Internal Medicine

## 2020-09-22 DIAGNOSIS — Z86010 Personal history of colonic polyps: Secondary | ICD-10-CM

## 2020-09-22 DIAGNOSIS — Z7984 Long term (current) use of oral hypoglycemic drugs: Secondary | ICD-10-CM | POA: Insufficient documentation

## 2020-09-22 DIAGNOSIS — Z79899 Other long term (current) drug therapy: Secondary | ICD-10-CM | POA: Insufficient documentation

## 2020-09-22 DIAGNOSIS — K635 Polyp of colon: Secondary | ICD-10-CM | POA: Diagnosis not present

## 2020-09-22 DIAGNOSIS — K5900 Constipation, unspecified: Secondary | ICD-10-CM | POA: Diagnosis not present

## 2020-09-22 DIAGNOSIS — F17210 Nicotine dependence, cigarettes, uncomplicated: Secondary | ICD-10-CM | POA: Diagnosis not present

## 2020-09-22 DIAGNOSIS — Z1211 Encounter for screening for malignant neoplasm of colon: Secondary | ICD-10-CM | POA: Insufficient documentation

## 2020-09-22 DIAGNOSIS — D124 Benign neoplasm of descending colon: Secondary | ICD-10-CM | POA: Diagnosis not present

## 2020-09-22 HISTORY — PX: POLYPECTOMY: SHX149

## 2020-09-22 HISTORY — PX: COLONOSCOPY: SHX5424

## 2020-09-22 LAB — GLUCOSE, CAPILLARY: Glucose-Capillary: 95 mg/dL (ref 70–99)

## 2020-09-22 SURGERY — COLONOSCOPY
Anesthesia: Moderate Sedation

## 2020-09-22 MED ORDER — SODIUM CHLORIDE 0.9 % IV SOLN: INTRAVENOUS | Status: DC

## 2020-09-22 MED ORDER — STERILE WATER FOR IRRIGATION IR SOLN
Status: DC | PRN
  Administered 2020-09-22: 100 mL

## 2020-09-22 MED ORDER — MEPERIDINE HCL 50 MG/ML IJ SOLN
INTRAMUSCULAR | Status: AC
  Filled 2020-09-22: qty 1

## 2020-09-22 MED ORDER — MIDAZOLAM HCL 5 MG/5ML IJ SOLN
INTRAMUSCULAR | Status: DC | PRN
  Administered 2020-09-22: 1 mg via INTRAVENOUS
  Administered 2020-09-22: 2 mg via INTRAVENOUS
  Administered 2020-09-22 (×2): 1 mg via INTRAVENOUS

## 2020-09-22 MED ORDER — ONDANSETRON HCL 4 MG/2ML IJ SOLN
INTRAMUSCULAR | Status: AC
  Filled 2020-09-22: qty 2

## 2020-09-22 MED ORDER — MEPERIDINE HCL 100 MG/ML IJ SOLN
INTRAMUSCULAR | Status: DC | PRN
  Administered 2020-09-22: 40 mg

## 2020-09-22 MED ORDER — MIDAZOLAM HCL 5 MG/5ML IJ SOLN
INTRAMUSCULAR | Status: AC
  Filled 2020-09-22: qty 10

## 2020-09-22 MED ORDER — ONDANSETRON HCL 4 MG/2ML IJ SOLN
INTRAMUSCULAR | Status: DC | PRN
  Administered 2020-09-22: 4 mg via INTRAVENOUS

## 2020-09-22 NOTE — H&P (Signed)
@LOGO @   Primary Care Physician:  Celene Squibb, MD Primary Gastroenterologist:  Dr. Gala Romney  Pre-Procedure History & Physical: HPI:  Jackson Morris is a 60 y.o. male here for surveillance colonoscopy.  Here for surveillance colonoscopy.  History of multiple colonic polyps removed 2018.  Doing now much better on with constipation.  No further rectal bleeding.  He states hemorrhoid banding helped a great deal.  Past Medical History:  Diagnosis Date   Diabetes ([place])    GERD (gastroesophageal reflux disease)    Headache    HTN (hypertension)    Sleep apnea     Past Surgical History:  Procedure Laterality Date   APPENDECTOMY     COLONOSCOPY N/A 12/20/2016   Dr. Gala Romney: multiple polyps, tubular adenoma, internal hemorrhoids, surveillance in 3 years    [place] N/A 02/28/2020   Procedure: HERNIA REPAIR INCISIONAL WITH MESH;  Surgeon: Aviva Signs, MD;  Location: [facility];  Service: General;  Laterality: N/A;   TONSILLECTOMY     UVULECTOMY      Prior to Admission medications   Medication Sig Start Date End Date Taking? Authorizing Provider  acetaminophen (TYLENOL) 500 MG tablet Take 1,000 mg by mouth every 8 (eight) hours as needed for moderate pain.   Yes [provider]  Aspirin-Acetaminophen-Caffeine [phone] MG PACK Take 1 Package by mouth in the morning.    Yes [provider]  FIBER PO Take 1 tablet by mouth daily.   Yes [provider]  glimepiride (AMARYL) 4 MG tablet Take 4 mg by mouth in the morning and at bedtime.  10/17/16  Yes [provider]  MAGNESIUM PO Take 1 tablet by mouth daily.   Yes [provider]  metFORMIN (GLUCOPHAGE) 500 MG tablet Take 500 mg by mouth 2 (two) times daily.  04/12/19  Yes [provider]  Multiple Vitamin (MULTIVITAMIN) tablet Take 1 tablet by mouth daily.   Yes [provider]  olmesartan-hydrochlorothiazide (BENICAR HCT) 40-25 MG tablet Take 1 tablet by mouth  daily. 10/17/16  Yes [provider]  pantoprazole (PROTONIX) 40 MG tablet TAKE 1 TABLET BY MOUTH EVERY DAY Patient taking differently: Take 40 mg by mouth daily. 03/01/20  Yes Annitta Needs, NP  rosuvastatin (CRESTOR) 10 MG tablet Take 10 mg by mouth daily.  10/17/16  Yes [provider]  clotrimazole-betamethasone (LOTRISONE) cream Apply 1 application topically daily as needed (eczema).  09/26/16   [provider]    Allergies as of 08/07/2020   (No Known Allergies)    Family History  Problem Relation Age of Onset   Cancer Mother    Stroke Mother    Diabetes Father    Colon cancer Neg Hx     Social History   Socioeconomic History   Marital status: Married    Spouse name: Not on file   Number of children: Not on file   Years of education: Not on file   Highest education level: Not on file  Occupational History   Not on file  Tobacco Use   Smoking status: Current Every Day Smoker    Packs/day: 1.00    Years: 35.00    Pack years: 35.00    Types: E-cigarettes    Last attempt to quit: 07/08/2012    Years since quitting: 8.2   Smokeless tobacco: Never Used   Tobacco comment: Currently smokes e cig. Quit tobacco in 2014  Vaping Use   Vaping Use: Every day  Substance and Sexual Activity  Alcohol use: Not Currently    Comment: occ   Drug use: No   Sexual activity: Not on file  Other Topics Concern   Not on file  Social History Narrative   Not on file   Social Determinants of Health   Financial Resource Strain: Not on file  Food Insecurity: Not on file  Transportation Needs: Not on file  Physical Activity: Not on file  Stress: Not on file  Social Connections: Not on file  Intimate Partner Violence: Not on file    Review of Systems: See HPI, otherwise negative ROS  Physical Exam: BP (!) 155/78    Pulse (!) 57    Temp 98.5 F (36.9 C) (Oral)    Resp 17    SpO2 95%  General:   Alert,  Well-developed, well-nourished,  pleasant and cooperative in NAD opathy. Lungs:  Clear throughout to auscultation.   No wheezes, crackles, or rhonchi. No acute distress. Heart:  Regular rate and rhythm; no murmurs, clicks, rubs,  or gallops. Abdomen: Non-distended, normal bowel sounds.  Soft and nontender without appreciable mass or hepatosplenomegaly.  Pulses:  Normal pulses noted. Extremities:  Without clubbing or edema.  Impression/Plan: 60 year old gentleman here for surveillance colonoscopy per plan. The risks, benefits, limitations, alternatives and imponderables have been reviewed with the patient. Questions have been answered. All parties are agreeable.      Notice: This dictation was prepared with Dragon dictation along with smaller phrase technology. Any transcriptional errors that result from this process are unintentional and may not be corrected upon review.

## 2020-09-22 NOTE — Discharge Instructions (Signed)
Colonoscopy Discharge Instructions  Read the instructions outlined below and refer to this sheet in the next few weeks. These discharge instructions provide you with general information on caring for yourself after you leave the hospital. Your doctor may also give you specific instructions. While your treatment has been planned according to the most current medical practices available, unavoidable complications occasionally occur. If you have any problems or questions after discharge, call Dr. Gala Romney at [phone]. ACTIVITY  You may resume your regular activity, but move at a slower pace for the next 24 hours.   Take frequent rest periods for the next 24 hours.   Walking will help get rid of the air and reduce the bloated feeling in your belly (abdomen).   No driving for 24 hours (because of the medicine (anesthesia) used during the test).    Do not sign any important legal documents or operate any machinery for 24 hours (because of the anesthesia used during the test).  NUTRITION  Drink plenty of fluids.   You may resume your normal diet as instructed by your doctor.   Begin with a light meal and progress to your normal diet. Heavy or fried foods are harder to digest and may make you feel sick to your stomach (nauseated).   Avoid alcoholic beverages for 24 hours or as instructed.  MEDICATIONS  You may resume your normal medications unless your doctor tells you otherwise.  WHAT YOU CAN EXPECT TODAY  Some feelings of bloating in the abdomen.   Passage of more gas than usual.   Spotting of blood in your stool or on the toilet paper.  IF YOU HAD POLYPS REMOVED DURING THE COLONOSCOPY:  No aspirin products for 7 days or as instructed.   No alcohol for 7 days or as instructed.   Eat a soft diet for the next 24 hours.  FINDING OUT THE RESULTS OF YOUR TEST Not all test results are available during your visit. If your test results are not back during the visit, make an appointment  with your caregiver to find out the results. Do not assume everything is normal if you have not heard from your caregiver or the medical facility. It is important for you to follow up on all of your test results.  SEEK IMMEDIATE MEDICAL ATTENTION IF:  You have more than a spotting of blood in your stool.   Your belly is swollen (abdominal distention).   You are nauseated or vomiting.   You have a temperature over 101.   You have abdominal pain or discomfort that is severe or gets worse throughout the day.   1 polyp removed from your colon today  Further recommendations to follow pending review of pathology report  At patient request, called Stacy at [phone] -discussed findings and recommendations   Colon Polyps  Colon polyps are tissue growths inside the colon, which is part of the large intestine. They are one of the types of polyps that can grow in the body. A polyp may be a round bump or a mushroom-shaped growth. You could have one polyp or more than one. Most colon polyps are noncancerous (benign). However, some colon polyps can become cancerous over time. Finding and removing the polyps early can help prevent this. What are the causes? The exact cause of colon polyps is not known. What increases the risk? The following factors may make you more likely to develop this condition:  Having a family history of colorectal cancer or colon polyps.  Being older  than 60 years of age.  Being younger than 60 years of age and having a significant family history of colorectal cancer or colon polyps or a genetic condition that puts you at higher risk of getting colon polyps.  Having inflammatory bowel disease, such as ulcerative colitis or Crohn's disease.  Having certain conditions passed from parent to child (hereditary conditions), such as: ? Familial adenomatous polyposis (FAP). ? Lynch syndrome. ? Turcot syndrome. ? Peutz-Jeghers syndrome. ? MUTYH-associated polyposis  (MAP).  Being overweight.  Certain lifestyle factors. These include smoking cigarettes, drinking too much alcohol, not getting enough exercise, and eating a diet that is high in fat and red meat and low in fiber.  Having had childhood cancer that was treated with radiation of the abdomen. What are the signs or symptoms? Many times, there are no symptoms. If you have symptoms, they may include:  Blood coming from the rectum during a bowel movement.  Blood in the stool (feces). The blood may be bright red or very dark in color.  Pain in the abdomen.  A change in bowel habits, such as constipation or diarrhea. How is this diagnosed? This condition is diagnosed with a colonoscopy. This is a procedure in which a lighted, flexible scope is inserted into the opening between the buttocks (anus) and then passed into the colon to examine the area. Polyps are sometimes found when a colonoscopy is done as part of routine cancer screening tests. How is this treated? This condition is treated by removing any polyps that are found. Most polyps can be removed during a colonoscopy. Those polyps will then be tested for cancer. Additional treatment may be needed depending on the results of testing. Follow these instructions at home: Eating and drinking  Eat foods that are high in fiber, such as fruits, vegetables, and whole grains.  Eat foods that are high in calcium and vitamin D, such as milk, cheese, yogurt, eggs, liver, fish, and broccoli.  Limit foods that are high in fat, such as fried foods and desserts.  Limit the amount of red meat, precooked or cured meat, or other processed meat that you eat, such as hot dogs, sausages, bacon, or meat loaves.  Limit sugary drinks.   Lifestyle  Maintain a healthy weight, or lose weight if recommended by your health care provider.  Exercise every day or as told by your health care provider.  Do not use any products that contain nicotine or tobacco, such  as cigarettes, e-cigarettes, and chewing tobacco. If you need help quitting, ask your health care provider.  Do not drink alcohol if: ? Your health care provider tells you not to drink. ? You are pregnant, may be pregnant, or are planning to become pregnant.  If you drink alcohol: ? Limit how much you use to:  0-1 drink a day for women.  0-2 drinks a day for men. ? Know how much alcohol is in your drink. In the [place] one drink equals one 12 oz bottle of beer (355 mL), one 5 oz glass of wine (148 mL), or one 1 oz glass of hard liquor (44 mL). General instructions  Take over-the-counter and prescription medicines only as told by your health care provider.  Keep all follow-up visits. This is important. This includes having regularly scheduled colonoscopies. Talk to your health care provider about when you need a colonoscopy. Contact a health care provider if:  You have new or worsening bleeding during a bowel movement.  You have new or increased  blood in your stool.  You have a change in bowel habits.  You lose weight for no known reason. Summary  Colon polyps are tissue growths inside the colon, which is part of the large intestine. They are one type of polyp that can grow in the body.  Most colon polyps are noncancerous (benign), but some can become cancerous over time.  This condition is diagnosed with a colonoscopy.  This condition is treated by removing any polyps that are found. Most polyps can be removed during a colonoscopy. This information is not intended to replace advice given to you by your health care provider. Make sure you discuss any questions you have with your health care provider. Document Revised: 10/13/2019 Document Reviewed: 10/13/2019 Elsevier Patient Education  2021 [employer].

## 2020-09-22 NOTE — Op Note (Signed)
[facility] Patient Name: Jackson Morris Procedure Date: 09/22/2020 7:08 AM MRN: [id number] Date of Birth: 1961-06-29 Attending MD: Norvel Richards , MD CSN: [id number] Age: 60 Admit Type: Outpatient Procedure:                Colonoscopy Indications:              High risk colon cancer surveillance: Personal                            history of colonic polyps Providers:                Norvel Richards, MD, Crystal Page, Randa Spike, Technician Referring MD:              Medicines:                Midazolam 5 mg IV, Meperidine 40 mg IV Complications:            No immediate complications. Estimated Blood Loss:     Estimated blood loss was minimal. Procedure:                Pre-Anesthesia Assessment:                           - Prior to the procedure, a History and Physical                            was performed, and patient medications and                            allergies were reviewed. The patient's tolerance of                            previous anesthesia was also reviewed. The risks                            and benefits of the procedure and the sedation                            options and risks were discussed with the patient.                            All questions were answered, and informed consent                            was obtained. Prior Anticoagulants: The patient has                            taken no previous anticoagulant or antiplatelet                            agents. ASA Grade Assessment: II - A patient with  mild systemic disease. After reviewing the risks                            and benefits, the patient was deemed in                            satisfactory condition to undergo the procedure.                           After obtaining informed consent, the colonoscope                            was passed under direct vision. Throughout the                            procedure,  the patient's blood pressure, pulse, and                            oxygen saturations were monitored continuously. The                            CF-[id number] ([id number]) scope was introduced through                            the anus and advanced to the the cecum, identified                            by appendiceal orifice and ileocecal valve. The                            colonoscopy was performed without difficulty. The                            patient tolerated the procedure well. The quality                            of the bowel preparation was adequate. Scope In: 7:49:34 AM Scope Out: 8:07:14 AM Scope Withdrawal Time: 0 hours 10 minutes 31 seconds  Total Procedure Duration: 0 hours 17 minutes 40 seconds  Findings:      The perianal and digital rectal examinations were normal.      A 5 mm polyp was found in the descending colon. The polyp was sessile.       The polyp was removed with a cold snare. Resection and retrieval were       complete. Estimated blood loss was minimal.      The exam was otherwise without abnormality on direct and retroflexion       views aside from scar related to prior hemorrhoid banding. Impression:               - One 5 mm polyp in the descending colon, removed                            with a cold snare. Resected and retrieved.                           -  The examination was otherwise normal on direct                            and retroflexion views. Moderate Sedation:      Moderate (conscious) sedation was administered by the endoscopy nurse       and supervised by the endoscopist. The following parameters were       monitored: oxygen saturation, heart rate, blood pressure, and response       to care. Total physician intraservice time was 23 minutes. Recommendation:           - Patient has a contact number available for                            emergencies. The signs and symptoms of potential                            delayed complications were  discussed with the                            patient. Return to normal activities tomorrow.                            Written discharge instructions were provided to the                            patient.                           - Advance diet as tolerated.                           - Continue present medications.                           - Repeat colonoscopy date to be determined after                            pending pathology results are reviewed for                            surveillance.                           - Return to GI office (date not yet determined). Procedure Code(s):        --- Professional ---                           [phone], Colonoscopy, flexible; with removal of                            tumor(s), polyp(s), or other lesion(s) by snare                            technique  63785, Moderate sedation; each additional 15                            minutes intraservice time                           G0500, Moderate sedation services provided by the                            same physician or other qualified health care                            professional performing a gastrointestinal                            endoscopic service that sedation supports,                            requiring the presence of an independent trained                            observer to assist in the monitoring of the                            patient's level of consciousness and physiological                            status; initial 15 minutes of intra-service time;                            patient age 50 years or older (additional time may                            be reported with [phone], as appropriate) Diagnosis Code(s):        --- Professional ---                           Z86.010, Personal history of colonic polyps                           K63.5, Polyp of colon CPT copyright 2019 American Medical Association. All rights reserved. The codes  documented in this report are preliminary and upon coder review may  be revised to meet current compliance requirements. Cristopher Estimable. Zerek Litsey, MD Norvel Richards, MD 09/22/2020 10:04:07 AM This report has been signed electronically. Number of Addenda: 0

## 2020-09-25 ENCOUNTER — Encounter: Payer: Self-pay | Admitting: Internal Medicine

## 2020-09-25 LAB — SURGICAL PATHOLOGY

## 2020-10-02 ENCOUNTER — Encounter (HOSPITAL_COMMUNITY): Payer: Self-pay | Admitting: Internal Medicine

## 2020-12-30 ENCOUNTER — Other Ambulatory Visit: Payer: Self-pay | Admitting: Gastroenterology

## 2021-01-03 ENCOUNTER — Other Ambulatory Visit: Payer: Self-pay

## 2021-01-03 ENCOUNTER — Other Ambulatory Visit (HOSPITAL_COMMUNITY): Payer: 59

## 2021-01-03 DIAGNOSIS — I2510 Atherosclerotic heart disease of native coronary artery without angina pectoris: Secondary | ICD-10-CM

## 2021-01-26 ENCOUNTER — Encounter (HOSPITAL_COMMUNITY): Payer: Self-pay

## 2021-01-26 ENCOUNTER — Ambulatory Visit (HOSPITAL_COMMUNITY): Payer: 59

## 2021-01-26 ENCOUNTER — Encounter (HOSPITAL_COMMUNITY): Payer: 59

## 2021-03-30 ENCOUNTER — Other Ambulatory Visit: Payer: Self-pay

## 2021-03-30 ENCOUNTER — Ambulatory Visit (HOSPITAL_COMMUNITY)
Admission: RE | Admit: 2021-03-30 | Discharge: 2021-03-30 | Disposition: A | Discharge status: Home / Self Care | Payer: 59 | Source: Ambulatory Visit | Attending: Cardiology | Admitting: Cardiology

## 2021-03-30 ENCOUNTER — Encounter (HOSPITAL_COMMUNITY): Payer: Self-pay

## 2021-03-30 DIAGNOSIS — I2510 Atherosclerotic heart disease of native coronary artery without angina pectoris: Secondary | ICD-10-CM | POA: Insufficient documentation

## 2021-03-30 LAB — NM MYOCAR MULTI W/SPECT W/WALL MOTION / EF
Angina Index: 0
Duke Treadmill Score: 8
Estimated workload: 10.1
Exercise duration (min): 7 min
Exercise duration (sec): 35 s
LV dias vol: 138 mL (ref 62–150)
LV sys vol: 57 mL
MPHR: 160 {beats}/min
Nuc Stress EF: 58 %
Peak HR: 129 {beats}/min
Percent HR: 80 %
RATE: 0.3
RPE: 17
Rest HR: 48 {beats}/min
Rest Nuclear Isotope Dose: 10.3 mCi
SDS: 1
SRS: 1
SSS: 2
ST Depression (mm): 0 mm
Stress Nuclear Isotope Dose: 32.5 mCi
TID: 1

## 2021-03-30 MED ORDER — SODIUM CHLORIDE FLUSH 0.9 % IV SOLN
INTRAVENOUS | Status: AC
  Administered 2021-03-30: 10 mL via INTRAVENOUS
  Filled 2021-03-30: qty 10

## 2021-03-30 MED ORDER — TECHNETIUM TC 99M TETROFOSMIN IV KIT
10.0000 | PACK | Freq: Once | INTRAVENOUS | Status: AC | PRN
  Administered 2021-03-30: 10.26 via INTRAVENOUS

## 2021-03-30 MED ORDER — REGADENOSON 0.4 MG/5ML IV SOLN
INTRAVENOUS | Status: AC
  Administered 2021-03-30: 0.4 mg via INTRAVENOUS
  Filled 2021-03-30: qty 5

## 2021-03-30 MED ORDER — TECHNETIUM TC 99M TETROFOSMIN IV KIT
30.0000 | PACK | Freq: Once | INTRAVENOUS | Status: AC | PRN
  Administered 2021-03-30: 32.5 via INTRAVENOUS

## 2021-04-26 ENCOUNTER — Encounter: Payer: Self-pay | Admitting: *Deleted

## 2021-04-26 ENCOUNTER — Telehealth: Payer: Self-pay | Admitting: *Deleted

## 2021-04-26 NOTE — Telephone Encounter (Signed)
Laurine Blazer, LPN  35/39/1225  8:34 PM EDT Back to Top    Notified via my chart.  Copy to pcp.

## 2021-04-26 NOTE — Telephone Encounter (Signed)
-----   Message from Arnoldo Lenis, MD sent at 04/16/2021 10:27 AM EDT ----- Normal stress test, no evidence of any significant blockages. F/u just as needed  Zandra Abts MD

## 2021-06-25 ENCOUNTER — Other Ambulatory Visit: Payer: Self-pay | Admitting: *Deleted

## 2021-06-25 DIAGNOSIS — Z87891 Personal history of nicotine dependence: Secondary | ICD-10-CM

## 2021-08-03 ENCOUNTER — Ambulatory Visit (HOSPITAL_COMMUNITY)
Admission: RE | Admit: 2021-08-03 | Discharge: 2021-08-03 | Disposition: A | Discharge status: Home / Self Care | Payer: 59 | Source: Ambulatory Visit | Attending: Acute Care | Admitting: Acute Care

## 2021-08-03 ENCOUNTER — Other Ambulatory Visit: Payer: Self-pay

## 2021-08-03 DIAGNOSIS — Z87891 Personal history of nicotine dependence: Secondary | ICD-10-CM | POA: Diagnosis not present

## 2021-08-06 ENCOUNTER — Other Ambulatory Visit: Payer: Self-pay | Admitting: Acute Care

## 2021-08-06 DIAGNOSIS — Z87891 Personal history of nicotine dependence: Secondary | ICD-10-CM

## 2022-02-17 ENCOUNTER — Other Ambulatory Visit: Payer: Self-pay | Admitting: Gastroenterology

## 2022-02-19 ENCOUNTER — Ambulatory Visit: Payer: 59 | Admitting: Gastroenterology

## 2022-03-28 ENCOUNTER — Telehealth (INDEPENDENT_AMBULATORY_CARE_PROVIDER_SITE_OTHER): Payer: 59 | Admitting: Gastroenterology

## 2022-03-28 ENCOUNTER — Telehealth: Payer: Self-pay

## 2022-03-28 ENCOUNTER — Encounter: Payer: Self-pay | Admitting: Gastroenterology

## 2022-03-28 VITALS — Ht 68.0 in | Wt 190.0 lb

## 2022-03-28 DIAGNOSIS — K2100 Gastro-esophageal reflux disease with esophagitis, without bleeding: Secondary | ICD-10-CM

## 2022-03-28 MED ORDER — PANTOPRAZOLE SODIUM 40 MG PO TBEC: 40.0000 mg | DELAYED_RELEASE_TABLET | Freq: Every day | ORAL | 3 refills | Status: DC

## 2022-03-28 NOTE — Telephone Encounter (Signed)
Jackson Morris, you are scheduled for a virtual visit with your provider today.  Just as we do with appointments in the office, we must obtain your consent to participate.  Your consent will be active for this visit and any virtual visit you may have with one of our providers in the next 365 days.  If you have a MyChart account, I can also send a copy of this consent to you electronically.  All virtual visits are billed to your insurance company just like a traditional visit in the office.  As this is a virtual visit, video technology does not allow for your provider to perform a traditional examination.  This may limit your provider's ability to fully assess your condition.  If your provider identifies any concerns that need to be evaluated in person or the need to arrange testing such as labs, EKG, etc, we will make arrangements to do so.  Although advances in technology are sophisticated, we cannot ensure that it will always work on either your end or our end.  If the connection with a video visit is poor, we may have to switch to a telephone visit.  With either a video or telephone visit, we are not always able to ensure that we have a secure connection.   I need to obtain your verbal consent now.   Are you willing to proceed with your visit today? Yes.

## 2022-03-28 NOTE — Progress Notes (Signed)
Primary Care Physician:  Celene Squibb, MD  Primary GI: Dr. Gala Romney  Patient Location: Home   Provider Location: [facility] office   Reason for Visit: further refills on pantoprazole    Persons present on the virtual encounter, with roles: patient - Jackson Morris; Venetia Night, NP   Total time (minutes) spent on medical discussion: 5 minutes  Virtual Visit Encounter Note Visit is conducted virtually and was requested by patient.   I connected with [name] on 03/28/22 at  3:30 PM EDT by video and verified that I am speaking with the correct person using two identifiers.   I discussed the limitations, risks, security and privacy concerns of performing an evaluation and management service by video and the availability of in person appointments. I also discussed with the patient that there may be a patient responsible charge related to this service. The patient expressed understanding and agreed to proceed.  Chief Complaint  Patient presents with   Gastroesophageal Reflux    For further refills on pantoprazole    History of Present Illness: Jackson Morris is a 61 y.o. male with a history of diabetes, GERD, HTN, and sleep apnea presenting today for follow up and further refills.   Last seen in the office October 2020 for follow up on constipation and GERD. Was taking daily fiber supplement. History of colonic adenoma. Nocturnal GERD symptoms usually about 3 times per week. No history of upper GI evaluation. Was taking famotidine 20 mg BID. No dysphagia. Had prior hemorrhoid banding. Was to be scheduled for EGD and TCS in June 2021. Given pantoprazole 40 mg daily.   Colonoscopy performed 09/22/2020: 5 mm tubular adenoma in the descending colon, exam otherwise normal. Advised repeat in 5 years.   Today: Does great unless he misses a dose. Taking it daily. Mostly has evening symptoms. Trigger foods: pizza, anything tomato based. No issues with red meats. Not really any issues with fried  foods, stays away from them. Denies any dysphagia. Symptoms are usually upper throat burning. Has some acid to come up into his mouth but not really any breakthrough symptoms.Denies any constipation of diarrhea, lack of appetite. Has been actively trying to lose weight. Has lost about 25 lbs over the last 6 months. Has been taking farxiga and thinks that has been helping with his blood sugars and weight loss.    Medications Current Meds  Medication Sig   acetaminophen (TYLENOL) 500 MG tablet Take 1,000 mg by mouth every 8 (eight) hours as needed for moderate pain.   Aspirin-Acetaminophen-Caffeine 500-325-65 MG PACK Take 1 Package by mouth in the morning.    clotrimazole-betamethasone (LOTRISONE) cream Apply 1 application topically daily as needed (eczema).    FARXIGA 10 MG TABS tablet Take 10 mg by mouth daily.   FIBER PO Take 1 tablet by mouth daily.   glimepiride (AMARYL) 4 MG tablet Take 4 mg by mouth daily with breakfast.   metFORMIN (GLUCOPHAGE) 500 MG tablet Take 500 mg by mouth daily with breakfast.   olmesartan-hydrochlorothiazide (BENICAR HCT) 40-25 MG tablet Take 1 tablet by mouth daily.   rosuvastatin (CRESTOR) 10 MG tablet Take 10 mg by mouth daily.    sildenafil (REVATIO) 20 MG tablet SMARTSIG:1-3 Tablet(s) By Mouth   [DISCONTINUED] pantoprazole (PROTONIX) 40 MG tablet TAKE 1 TABLET BY MOUTH EVERY DAY     History Past Medical History:  Diagnosis Date   Diabetes ([place])    GERD (gastroesophageal reflux disease)    Headache  HTN (hypertension)    Sleep apnea     Past Surgical History:  Procedure Laterality Date   APPENDECTOMY     COLONOSCOPY N/A 12/20/2016   Dr. Gala Romney: multiple polyps, tubular adenoma, internal hemorrhoids, surveillance in 3 years    COLONOSCOPY N/A 09/22/2020   Procedure: COLONOSCOPY;  Surgeon: Daneil Dolin, MD;  Location: [facility];  Service: Endoscopy;  Laterality: N/A;  ASA II/ AM procedure   INCISIONAL HERNIA REPAIR N/A 02/28/2020   Procedure:  HERNIA REPAIR INCISIONAL WITH MESH;  Surgeon: Aviva Signs, MD;  Location: [facility];  Service: General;  Laterality: N/A;   POLYPECTOMY  09/22/2020   Procedure: POLYPECTOMY INTESTINAL;  Surgeon: Daneil Dolin, MD;  Location: [facility];  Service: Endoscopy;;   TONSILLECTOMY     UVULECTOMY      Family History  Problem Relation Age of Onset   Cancer Mother    Stroke Mother    Diabetes Father    Colon cancer Neg Hx     Social History   Socioeconomic History   Marital status: Married    Spouse name: Not on file   Number of children: Not on file   Years of education: Not on file   Highest education level: Not on file  Occupational History   Not on file  Tobacco Use   Smoking status: Former    Packs/day: 1.00    Years: 35.00    Total pack years: 35.00    Types: E-cigarettes, Cigarettes    Quit date: 07/08/2012    Years since quitting: 9.7   Smokeless tobacco: Never   Tobacco comments:    Currently smokes e cig. Quit tobacco in 2014  Vaping Use   Vaping Use: Every day  Substance and Sexual Activity   Alcohol use: Not Currently    Comment: occ   Drug use: No   Sexual activity: Yes  Other Topics Concern   Not on file  Social History Narrative   Not on file   Social Determinants of Health   Financial Resource Strain: Not on file  Food Insecurity: Not on file  Transportation Needs: Not on file  Physical Activity: Not on file  Stress: Not on file  Social Connections: Not on file      Review of Systems: Gen: Denies fever, chills, anorexia. Denies fatigue, weakness, weight loss.  CV: Denies chest pain, palpitations, syncope, peripheral edema, and claudication. Resp: Denies dyspnea at rest, cough, wheezing, coughing up blood, and pleurisy. GI: see HPI Derm: Denies rash, itching, dry skin Psych: Denies depression, anxiety, memory loss, confusion. No homicidal or suicidal ideation.  Heme: Denies bruising, bleeding, and enlarged lymph  nodes.  Observations/Objective: No distress. Alert and oriented. Pleasant. Well nourished. Normal mood and affect. Unable to perform complete physical exam due to video encounter. No video available.    Assessment:  GERD is well-controlled on pantoprazole 40 mg daily.  Has very rare breakthrough symptoms, but if so they usually occur in the evenings.  Tomato based products are his typical trigger.  He is able to eat fried/fatty foods and red meats without any issues.  Does notice issues with reflux if he forgets to take his medication.  Usually does not have to take any Tums or anything for breakthrough relief.  Overall doing well, will continue current regimen.   Plan:  On recall for colonoscopy in 2027 Pantoprazole 40 mg daily, refill sent to pharmacy. GERD diet/lifestyle modifications Follow-up in 1 year or sooner if needed.  Follow Up Instructions:  I discussed the assessment and treatment plan with the patient. The patient was provided an opportunity to ask questions and all were answered. The patient agreed with the plan and demonstrated an understanding of the instructions.   The patient was advised to call back or seek an in-person evaluation if the symptoms worsen or if the condition fails to improve as anticipated.    Venetia Night, MSN, FNP-BC, AGACNP-BC [facility]

## 2022-03-28 NOTE — Patient Instructions (Signed)
I have sent refill of your pantoprazole to the [facility] on [address] in [place].  I have provided you with a year supply.  Follow a GERD diet:  Avoid fried, fatty, greasy, spicy, citrus foods, and tomato based products. Avoid caffeine and carbonated beverages. Avoid chocolate. Try eating 4-6 small meals a day rather than 3 large meals. Do not eat within 3 hours of laying down. Prop head of bed up on wood or bricks to create a 6 inch incline.  We will have you follow-up in a year or sooner if needed.  If virtual visits are convenient, your follow-up may also be virtual.  I hope you have a great upcoming holiday season!  It was a pleasure to see you today. I want to create trusting relationships with patients. If you receive a survey regarding your visit,  I greatly appreciate you taking time to fill this out on paper or through your MyChart. I value your feedback.  Venetia Night, MSN, FNP-BC, AGACNP-BC [facility]

## 2022-05-07 ENCOUNTER — Encounter: Payer: Self-pay | Admitting: Internal Medicine

## 2022-08-05 ENCOUNTER — Ambulatory Visit (HOSPITAL_COMMUNITY)
Admission: RE | Admit: 2022-08-05 | Discharge: 2022-08-05 | Disposition: A | Discharge status: Home / Self Care | Payer: 59 | Source: Ambulatory Visit | Attending: Acute Care | Admitting: Acute Care

## 2022-08-05 DIAGNOSIS — J439 Emphysema, unspecified: Secondary | ICD-10-CM | POA: Insufficient documentation

## 2022-08-05 DIAGNOSIS — I2510 Atherosclerotic heart disease of native coronary artery without angina pectoris: Secondary | ICD-10-CM | POA: Diagnosis not present

## 2022-08-05 DIAGNOSIS — Z87891 Personal history of nicotine dependence: Secondary | ICD-10-CM

## 2022-08-05 DIAGNOSIS — Z122 Encounter for screening for malignant neoplasm of respiratory organs: Secondary | ICD-10-CM | POA: Insufficient documentation

## 2022-08-05 DIAGNOSIS — I700 Atherosclerosis of aorta: Secondary | ICD-10-CM | POA: Insufficient documentation

## 2022-08-06 ENCOUNTER — Other Ambulatory Visit: Payer: Self-pay | Admitting: Acute Care

## 2022-08-06 DIAGNOSIS — Z87891 Personal history of nicotine dependence: Secondary | ICD-10-CM

## 2022-08-06 DIAGNOSIS — Z122 Encounter for screening for malignant neoplasm of respiratory organs: Secondary | ICD-10-CM

## 2022-12-20 ENCOUNTER — Other Ambulatory Visit (HOSPITAL_COMMUNITY): Payer: Self-pay | Admitting: Internal Medicine

## 2022-12-20 DIAGNOSIS — F17201 Nicotine dependence, unspecified, in remission: Secondary | ICD-10-CM

## 2023-01-26 ENCOUNTER — Other Ambulatory Visit: Payer: Self-pay | Admitting: Gastroenterology

## 2023-01-26 DIAGNOSIS — K2100 Gastro-esophageal reflux disease with esophagitis, without bleeding: Secondary | ICD-10-CM

## 2023-02-05 ENCOUNTER — Encounter (HOSPITAL_COMMUNITY): Payer: Self-pay

## 2023-02-05 ENCOUNTER — Ambulatory Visit (HOSPITAL_COMMUNITY): Payer: 59

## 2023-02-19 ENCOUNTER — Ambulatory Visit (HOSPITAL_COMMUNITY): Admission: RE | Admit: 2023-02-19 | Payer: 59 | Source: Ambulatory Visit

## 2023-03-06 ENCOUNTER — Encounter: Payer: Self-pay | Admitting: Gastroenterology

## 2023-04-08 ENCOUNTER — Encounter: Payer: Self-pay | Admitting: Gastroenterology

## 2023-04-08 ENCOUNTER — Ambulatory Visit (INDEPENDENT_AMBULATORY_CARE_PROVIDER_SITE_OTHER): Payer: 59 | Admitting: Gastroenterology

## 2023-04-08 VITALS — BP 138/76 | HR 51 | Temp 97.9°F | Ht 68.0 in | Wt 192.2 lb

## 2023-04-08 DIAGNOSIS — K219 Gastro-esophageal reflux disease without esophagitis: Secondary | ICD-10-CM | POA: Diagnosis not present

## 2023-04-08 DIAGNOSIS — K2100 Gastro-esophageal reflux disease with esophagitis, without bleeding: Secondary | ICD-10-CM

## 2023-04-08 NOTE — Patient Instructions (Signed)
Please attempt to reduce your pantoprazole to 20 mg daily.  You may cut your 40 mg tablets in half.  Try this for 1 week and if you do well then you can continue this and let me know when you need a refill.  If you have return of symptoms and would like to go back to 40 mg please just let me know that way I can refill appropriately.  We can plan to follow-up in 1 year, sooner if needed.  It was a pleasure to see you today. I want to create trusting relationships with patients. If you receive a survey regarding your visit,  I greatly appreciate you taking time to fill this out on paper or through your MyChart. I value your feedback.  Brooke Bonito, MSN, FNP-BC, AGACNP-BC [facility]

## 2023-04-08 NOTE — Progress Notes (Signed)
GI Office Note    Referring Provider: No ref. provider found Primary Care Physician:  Default, Provider, MD Primary Gastroenterologist: Gerrit Friends.Rourk, MD  Date:  04/08/2023  ID:  Jackson Morris, DOB Oct 29, 1960, MRN [id number]   Chief Complaint   Chief Complaint  Patient presents with   Follow-up    Doing well, no issues   History of Present Illness  Jackson Morris is a 62 y.o. male with a history of HTN, GERD, diabetes, and sleep apnea presenting today for annual follow-up of GERD.  Last visit completed virtually 03/28/2022.  Doing great is in regards to his reflux as long as he does not miss a dose of medication.  Trigger foods includes pizza and anything with tomato based sauce.  Denied any dysphagia.  When symptoms do occur he has some throat burning.  Reportedly had a 25 pound weight loss over the prior 6 months.  Advised he would need repeat colonoscopy in 2027 and he could continue pantoprazole 40 mg once daily.  Advised to follow-up in 1 year, sooner if needed.  Today:  GERD - Denies any breakthrough symptoms. Sleeps on his left side and that seems to help. When he has ran out time to time he has a terrible week of symptoms. No N/V, dysphagia, abdominal pain.   Going to see oral surgeon soon regarding biting his cheek.   History of hemorrhoid banding in the past.   Lost 5lbs with [place]. Previously was on glyxambi.   Current Outpatient Medications  Medication Sig Dispense Refill   acetaminophen (TYLENOL) 500 MG tablet Take 1,000 mg by mouth every 8 (eight) hours as needed for moderate pain.     Aspirin-Acetaminophen-Caffeine 500-325-65 MG PACK Take 1 Package by mouth in the morning.      clobetasol cream (TEMOVATE) 0.05 % SMARTSIG:Sparingly Topical Twice Daily     clotrimazole-betamethasone (LOTRISONE) cream Apply 1 application topically daily as needed (eczema).   0   FARXIGA 10 MG TABS tablet Take 10 mg by mouth daily.     FIBER PO Take 1 tablet by mouth daily.      glimepiride (AMARYL) 4 MG tablet Take 4 mg by mouth daily with breakfast.  1   metFORMIN (GLUCOPHAGE) 500 MG tablet Take 500 mg by mouth daily with breakfast.     olmesartan-hydrochlorothiazide (BENICAR HCT) 40-25 MG tablet Take 1 tablet by mouth daily.  1   pantoprazole (PROTONIX) 40 MG tablet TAKE 1 TABLET BY MOUTH EVERY DAY 90 tablet 3   rosuvastatin (CRESTOR) 10 MG tablet Take 10 mg by mouth daily.   1   sildenafil (REVATIO) 20 MG tablet SMARTSIG:1-3 Tablet(s) By Mouth     No current facility-administered medications for this visit.    Past Medical History:  Diagnosis Date   Diabetes ([facility])    GERD (gastroesophageal reflux disease)    Headache    HTN (hypertension)    Sleep apnea     Past Surgical History:  Procedure Laterality Date   APPENDECTOMY     COLONOSCOPY N/A 12/20/2016   Dr. Jena Gauss: multiple polyps, tubular adenoma, internal hemorrhoids, surveillance in 3 years    COLONOSCOPY N/A 09/22/2020   Dr. Jena Gauss: 5 mm tubular adenoma in the descending colon, exam otherwise normal. Advised repeat in 5 years.   INCISIONAL HERNIA REPAIR N/A 02/28/2020   Procedure: HERNIA REPAIR INCISIONAL WITH MESH;  Surgeon: Franky Macho, MD;  Location: [facility];  Service: General;  Laterality: N/A;   POLYPECTOMY  09/22/2020   Procedure:  POLYPECTOMY INTESTINAL;  Surgeon: Corbin Ade, MD;  Location: [facility];  Service: Endoscopy;;   TONSILLECTOMY     UVULECTOMY      Family History  Problem Relation Age of Onset   Cancer Mother    Stroke Mother    Diabetes Father    Colon cancer Neg Hx     Allergies as of 04/08/2023   (No Known Allergies)    Social History   Socioeconomic History   Marital status: Married    Spouse name: Not on file   Number of children: Not on file   Years of education: Not on file   Highest education level: Not on file  Occupational History   Not on file  Tobacco Use   Smoking status: Former    Current packs/day: 0.00    Average packs/day: 1 pack/day  for 35.0 years (35.0 ttl pk-yrs)    Types: E-cigarettes, Cigarettes    Start date: 07/08/1977    Quit date: 07/08/2012    Years since quitting: 10.7   Smokeless tobacco: Never   Tobacco comments:    Currently smokes e cig. Quit tobacco in 2014  Vaping Use   Vaping status: Every Day  Substance and Sexual Activity   Alcohol use: Not Currently    Comment: occ   Drug use: No   Sexual activity: Yes  Other Topics Concern   Not on file  Social History Narrative   Not on file   Social Determinants of Health   Financial Resource Strain: Not on file  Food Insecurity: Not on file  Transportation Needs: Not on file  Physical Activity: Not on file  Stress: Not on file  Social Connections: Not on file     Review of Systems   Gen: Denies fever, chills, anorexia. Denies fatigue, weakness, weight loss.  CV: Denies chest pain, palpitations, syncope, peripheral edema, and claudication. Resp: Denies dyspnea at rest, cough, wheezing, coughing up blood, and pleurisy. GI: See HPI Derm: Denies rash, itching, dry skin Psych: Denies depression, anxiety, memory loss, confusion. No homicidal or suicidal ideation.  Heme: Denies bruising, bleeding, and enlarged lymph nodes.   Physical Exam   BP 138/76 (BP Location: Right Arm, Patient Position: Sitting, Cuff Size: Normal)   Pulse (!) 51   Temp 97.9 F (36.6 C) (Oral)   Ht 5\' 8"  (1.727 m)   Wt 192 lb 3.2 oz (87.2 kg)   SpO2 97%   BMI 29.22 kg/m   General:   Alert and oriented. No distress noted. Pleasant and cooperative.  Head:  Normocephalic and atraumatic. Eyes:  Conjuctiva clear without scleral icterus. Mouth:  Oral mucosa pink and moist. Good dentition. No lesions. Abdomen:  +BS, soft, non-tender and non-distended. No rebound or guarding. No HSM or masses noted. Rectal: deferred Msk:  Symmetrical without gross deformities. Normal posture. Extremities:  Without edema. Neurologic:  Alert and  oriented x4 Psych:  Alert and cooperative.  Normal mood and affect.   Assessment  Jackson Morris is a 62 y.o. male with a history of HTN, GERD, diabetes, and sleep apnea presenting today for annual follow-up of GERD.  GERD: Well-controlled with pantoprazole 40 mg once daily.  Denies any breakthrough symptoms while on the medication.  Commonly has symptoms if he goes without medication at all.  Recommended trying reduced dose on a daily basis.  He will trial 20 mg daily and monitor for worsening symptoms.  If symptoms recur then he will resume 40 mg once daily and let  me know.  PLAN   Reduce pantoprazole to 20 mg daily, resume 40 mg daily if symptoms recur. GERD diet reinforced Follow-up in 1 year, sooner if needed.   Brooke Bonito, MSN, FNP-BC, AGACNP-BC [facility]

## 2023-05-27 ENCOUNTER — Other Ambulatory Visit: Payer: Self-pay | Admitting: Acute Care

## 2023-05-27 DIAGNOSIS — Z122 Encounter for screening for malignant neoplasm of respiratory organs: Secondary | ICD-10-CM

## 2023-05-27 DIAGNOSIS — Z87891 Personal history of nicotine dependence: Secondary | ICD-10-CM

## 2023-08-07 ENCOUNTER — Ambulatory Visit (HOSPITAL_COMMUNITY): Admission: RE | Admit: 2023-08-07 | Payer: Self-pay | Source: Ambulatory Visit

## 2023-08-12 ENCOUNTER — Ambulatory Visit (HOSPITAL_COMMUNITY)
Admission: RE | Admit: 2023-08-12 | Discharge: 2023-08-12 | Disposition: A | Discharge status: Home / Self Care | Payer: Self-pay | Source: Ambulatory Visit | Attending: Acute Care | Admitting: Acute Care

## 2023-08-12 DIAGNOSIS — Z87891 Personal history of nicotine dependence: Secondary | ICD-10-CM | POA: Insufficient documentation

## 2023-08-12 DIAGNOSIS — Z122 Encounter for screening for malignant neoplasm of respiratory organs: Secondary | ICD-10-CM | POA: Insufficient documentation

## 2023-08-22 ENCOUNTER — Other Ambulatory Visit: Payer: Self-pay | Admitting: Acute Care

## 2023-08-22 DIAGNOSIS — Z122 Encounter for screening for malignant neoplasm of respiratory organs: Secondary | ICD-10-CM

## 2023-08-22 DIAGNOSIS — Z87891 Personal history of nicotine dependence: Secondary | ICD-10-CM

## 2023-12-03 ENCOUNTER — Other Ambulatory Visit (INDEPENDENT_AMBULATORY_CARE_PROVIDER_SITE_OTHER): Payer: Self-pay

## 2023-12-03 ENCOUNTER — Encounter: Payer: Self-pay | Admitting: Orthopedic Surgery

## 2023-12-03 ENCOUNTER — Ambulatory Visit (INDEPENDENT_AMBULATORY_CARE_PROVIDER_SITE_OTHER): Payer: Self-pay | Admitting: Orthopedic Surgery

## 2023-12-03 VITALS — BP 115/72 | HR 55 | Ht 67.5 in | Wt 187.2 lb

## 2023-12-03 DIAGNOSIS — G8929 Other chronic pain: Secondary | ICD-10-CM

## 2023-12-03 DIAGNOSIS — M25511 Pain in right shoulder: Secondary | ICD-10-CM | POA: Diagnosis not present
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
## 2023-12-03 NOTE — Patient Instructions (Signed)

## 2023-12-19 ENCOUNTER — Other Ambulatory Visit: Payer: Self-pay | Admitting: Gastroenterology

## 2023-12-19 DIAGNOSIS — K2100 Gastro-esophageal reflux disease with esophagitis, without bleeding: Secondary | ICD-10-CM

## 2023-12-22 NOTE — Telephone Encounter (Signed)
 Pt advised.

## 2024-03-19 DIAGNOSIS — Z125 Encounter for screening for malignant neoplasm of prostate: Secondary | ICD-10-CM | POA: Diagnosis not present

## 2024-03-25 ENCOUNTER — Encounter: Payer: Self-pay | Admitting: Gastroenterology

## 2024-03-26 DIAGNOSIS — K219 Gastro-esophageal reflux disease without esophagitis: Secondary | ICD-10-CM | POA: Diagnosis not present

## 2024-03-26 DIAGNOSIS — E118 Type 2 diabetes mellitus with unspecified complications: Secondary | ICD-10-CM | POA: Diagnosis not present

## 2024-03-26 DIAGNOSIS — E1165 Type 2 diabetes mellitus with hyperglycemia: Secondary | ICD-10-CM | POA: Diagnosis not present

## 2024-03-26 DIAGNOSIS — I10 Essential (primary) hypertension: Secondary | ICD-10-CM | POA: Diagnosis not present

## 2024-03-26 DIAGNOSIS — E782 Mixed hyperlipidemia: Secondary | ICD-10-CM | POA: Diagnosis not present

## 2024-05-18 DIAGNOSIS — R051 Acute cough: Secondary | ICD-10-CM | POA: Diagnosis not present

## 2024-05-18 DIAGNOSIS — F17219 Nicotine dependence, cigarettes, with unspecified nicotine-induced disorders: Secondary | ICD-10-CM | POA: Diagnosis not present

## 2024-06-08 ENCOUNTER — Ambulatory Visit: Admitting: Gastroenterology

## 2024-06-08 ENCOUNTER — Encounter: Payer: Self-pay | Admitting: Gastroenterology

## 2024-06-08 DIAGNOSIS — Z79899 Other long term (current) drug therapy: Secondary | ICD-10-CM

## 2024-06-08 DIAGNOSIS — K2100 Gastro-esophageal reflux disease with esophagitis, without bleeding: Secondary | ICD-10-CM | POA: Diagnosis not present

## 2024-06-08 MED ORDER — PANTOPRAZOLE SODIUM 40 MG PO TBEC: 40.0000 mg | DELAYED_RELEASE_TABLET | Freq: Every day | ORAL | 3 refills | Status: AC

## 2024-06-08 NOTE — Patient Instructions (Signed)
 I have refilled your pantoprazole  for you.  I will like for you to get a pill cutter and start cutting your pantoprazole  in half and take half tablet daily (20 mg) and see if you continue to have good control of your reflux symptoms.  The goal is to keep you on his low doses possible to control your symptoms.  Follow a GERD diet:  Avoid fried, fatty, greasy, spicy, citrus foods. Avoid caffeine and carbonated beverages. Avoid chocolate. Try eating 4-6 small meals a day rather than 3 large meals. Do not eat within 3 hours of laying down. Prop head of bed up on wood or bricks to create a 6 inch incline.  The better control that you have your blood sugar and the more under control your weight is, this can also help with reflux.  I will see you in 1 year, sooner if needed.  You will be due for a colonoscopy in March or 2027.  It was a pleasure to see you today. I want to create trusting relationships with patients. If you receive a survey regarding your visit,  I greatly appreciate you taking time to fill this out on paper or through your MyChart. I value your feedback.  Charmaine Melia, MSN, FNP-BC, AGACNP-BC [facility]

## 2024-06-08 NOTE — Progress Notes (Signed)
 GI Office Note    Referring Provider: Shona Norleen PEDLAR, MD Primary Care Physician:  Shona Norleen PEDLAR, MD Primary Gastroenterologist: Lamar HERO.Rourk, MD  Date:  06/08/2024  ID:  Jackson Morris, DOB [birth year], MRN [id number]  Chief Complaint   Chief Complaint  Patient presents with   Follow-up    Follow up. Needs refill on medications    History of Present Illness  Jackson Morris is a 63 y.o. male with a history of diabetes, GERD, HTN, OSA, and hemorrhoids s/p banding in the past presenting today for medication refill.  Colonoscopy March 2022: - One 5 mm polyp in the descending colon, removed with a cold snare. Resected and retrieved.  - The examination was otherwise normal on direct and retroflexion views. - Path: tubular adenoma - Repeat in 5 years.   VV September 2023. Doing great in regards to his reflux as long as he does not miss a dose of medication. Trigger foods includes pizza and anything with tomato based sauce. Denied any dysphagia. When symptoms do occur he has some throat burning. Reportedly had a 25 pound weight loss over the prior 6 months. Advised he would need repeat colonoscopy in 2027 and he could continue pantoprazole  40 mg once daily. Advised to follow-up in 1 year, sooner if needed.   Last office visit 04/08/2023.  Denies any breakthrough symptoms, sleeping on his left side helps.  States he has had terrible breakthrough symptoms if he runs out of medication and will have been for about a week.  At the time no nausea, vomiting, dysphagia, or abdominal pain.  No recent issues with hemorrhoids, does have history of hemorrhoid banding in the past.  Advised to reduce pantoprazole  to 20 mg daily and resume 40 mg daily if symptoms recur, follow-up in 1 year or sooner if needed.  Reinforced GERD diet.  Today:  Discussed the use of AI scribe software for clinical note transcription with the patient, who gave verbal consent to proceed.  He recently experienced an upper respiratory  infection with significant sinus pressure, ear congestion, and shortness of breath, but without a severe cough. 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 He received treatment with cough medicine and other supportive care during the illness.  He has been taking pantoprazole  40 mg for acid reflux and has not experienced any symptoms recently. He denied any nausea, vomiting, dysphagia, and abdominal pain.  He has been taking Rybelsus, starting at 3 mg and increasing to 7 mg over the past two months. He notes an unpleasant taste when taken with water . His blood sugar levels were previously well-controlled, but changes in his job and insurance coverage led to weight gain and discontinuation of some medications, affecting his health. He is now working full-time again and trying to get back on track with his health management.  He mentions a history of a hernia but not having any pain in this area. He works in holiday representative, noting the physical demands and potential hazards of his job. He has lost weight in the past due to increased physical activity at work.      Wt Readings from Last 6 Encounters:  06/08/24 199 lb 6.4 oz (90.4 kg)  12/03/23 187 lb 3.2 oz (84.9 kg)  04/08/23 192 lb 3.2 oz (87.2 kg)  03/28/22 190 lb (86.2 kg)  08/25/20 204 lb 12.8 oz (92.9 kg)  05/15/20 207 lb 6.4 oz (94.1 kg)    Body mass index is  30.77 kg/m.   Current Outpatient Medications  Medication Sig Dispense Refill   acetaminophen  (TYLENOL ) 500 MG tablet Take 1,000 mg by mouth every 8 (eight) hours as needed for moderate pain.     albuterol (VENTOLIN HFA) 108 (90 Base) MCG/ACT inhaler Inhale 2 puffs into the lungs every 4 (four) hours as needed.     Aspirin-Acetaminophen -Caffeine 500-325-65 MG PACK Take 1 Package by mouth in the morning.      clobetasol cream (TEMOVATE) 0.05 % SMARTSIG:Sparingly Topical Twice Daily     clotrimazole-betamethasone (LOTRISONE) cream Apply 1  application topically daily as needed (eczema).   0   FARXIGA 10 MG TABS tablet Take 10 mg by mouth daily.     FIBER PO Take 1 tablet by mouth daily.     glimepiride (AMARYL) 4 MG tablet Take 4 mg by mouth daily with breakfast.  1   metFORMIN (GLUCOPHAGE) 500 MG tablet Take 500 mg by mouth daily with breakfast.     olmesartan-hydrochlorothiazide (BENICAR HCT) 40-25 MG tablet Take 1 tablet by mouth daily.  1   pantoprazole  (PROTONIX ) 40 MG tablet TAKE 1 TABLET BY MOUTH EVERY DAY 90 tablet 3   rosuvastatin (CRESTOR) 10 MG tablet Take 10 mg by mouth daily.   1   RYBELSUS 7 MG TABS Take 1 tablet by mouth daily.     sildenafil (REVATIO) 20 MG tablet SMARTSIG:1-3 Tablet(s) By Mouth     No current facility-administered medications for this visit.    Past Medical History:  Diagnosis Date   Diabetes (HCC)    GERD (gastroesophageal reflux disease)    Headache    HTN (hypertension)    Sleep apnea     Past Surgical History:  Procedure Laterality Date   APPENDECTOMY     COLONOSCOPY N/A 12/20/2016   Dr. Shaaron: multiple polyps, tubular adenoma, internal hemorrhoids, surveillance in 3 years    COLONOSCOPY N/A 09/22/2020   Dr. Shaaron: 5 mm tubular adenoma in the descending colon, exam otherwise normal. Advised repeat in 5 years.   INCISIONAL HERNIA REPAIR N/A 02/28/2020   Procedure: HERNIA REPAIR INCISIONAL WITH MESH;  Surgeon: Mavis Anes, MD;  Location: [facility];  Service: General;  Laterality: N/A;   POLYPECTOMY  09/22/2020   Procedure: POLYPECTOMY INTESTINAL;  Surgeon: Shaaron Lamar HERO, MD;  Location: [facility];  Service: Endoscopy;;   TONSILLECTOMY     UVULECTOMY      Family History  Problem Relation Age of Onset   Cancer Mother    Stroke Mother    Diabetes Father    Colon cancer Neg Hx     Allergies as of 06/08/2024   (No Active Allergies)    Social History   Socioeconomic History   Marital status: Married    Spouse name: Not on file   Number of children: Not on file    Years of education: Not on file   Highest education level: Not on file  Occupational History   Not on file  Tobacco Use   Smoking status: Former    Current packs/day: 0.00    Average packs/day: 1 pack/day for 35.0 years (35.0 ttl pk-yrs)    Types: E-cigarettes, Cigarettes    Start date: 07/08/1977    Quit date: 07/08/2012    Years since quitting: 11.9   Smokeless tobacco: Never   Tobacco comments:    Currently smokes e cig. Quit tobacco in 2014  Vaping Use   Vaping status: Every Day  Substance and Sexual Activity   Alcohol  use: Not Currently    Comment: occ   Drug use: No   Sexual activity: Yes  Other Topics Concern   Not on file  Social History Narrative   Not on file   Social Drivers of Health   Financial Resource Strain: Not on file  Food Insecurity: Not on file  Transportation Needs: Not on file  Physical Activity: Not on file  Stress: Not on file  Social Connections: Not on file    Review of Systems   Gen: Denies fever, chills, anorexia. Denies fatigue, weakness, weight loss.  CV: Denies chest pain, palpitations, syncope, peripheral edema, and claudication. Resp: Denies dyspnea at rest, cough, wheezing, coughing up blood, and pleurisy. GI: See HPI Derm: Denies rash, itching, dry skin Psych: Denies depression, anxiety, memory loss, confusion. No homicidal or suicidal ideation.  Heme: Denies bruising, bleeding, and enlarged lymph nodes.  Physical Exam   BP 113/69 (BP Location: Right Arm, Patient Position: Sitting, Cuff Size: Large)   Pulse 64   Temp 98 F (36.7 C) (Temporal)   Ht 5' 7.5 (1.715 m)   Wt 199 lb 6.4 oz (90.4 kg)   BMI 30.77 kg/m   General:   Alert and oriented. No distress noted. Pleasant and cooperative.  Head:  Normocephalic and atraumatic. Eyes:  Conjuctiva clear without scleral icterus. Mouth:  Oral mucosa pink and moist. Good dentition. No lesions. Abdomen:  +BS, soft, non-distended and non tender. Ventral hernia present that is soft, no  signs of obstruction. No rebound or guarding. No HSM. Rectal: deferred Msk:  Symmetrical without gross deformities. Normal posture. Extremities:  Without edema. Neurologic:  Alert and  oriented x4 Psych:  Alert and cooperative. Normal mood and affect.  Assessment & Plan  Jackson Morris is a 63 y.o. male presenting today for follow up on GERD for medication refill.      Gastroesophageal reflux disease Well-controlled on 40 mg pantoprazole  with no recent acid reflux symptoms. Discussed reducing the dose to 20 mg to minimize potential long-term side effects, including C. difficile infection, osteoporosis, and potential cognitive dysfunction. We discussed risks of ongoing PPI use in detail today. He prefers to try cutting the 40 mg tablets in half to save cost and trial the lower dose.  - Continue pantoprazole  40 mg tablets, cut in half to achieve 20 mg dose once daily. Resume 40 mg daily if having frequent breakthrough.  - Consider vitamin D and calcium supplementation if on long-term PPI therapy. - Given age may need to consider DEXA scan      Follow up   Follow up 1 year, sooner if needed.    Charmaine Melia, MSN, FNP-BC, AGACNP-BC [facility]
